# Patient Record
Sex: Female | Born: 1975
Health system: Southern US, Community
[De-identification: ages and names within clinical notes are randomized; demographics above are authoritative.]

## PROBLEM LIST (undated history)

## (undated) DIAGNOSIS — N200 Calculus of kidney: Secondary | ICD-10-CM

## (undated) DIAGNOSIS — R7303 Prediabetes: Secondary | ICD-10-CM

## (undated) DIAGNOSIS — E559 Vitamin D deficiency, unspecified: Secondary | ICD-10-CM

## (undated) DIAGNOSIS — R0989 Other specified symptoms and signs involving the circulatory and respiratory systems: Secondary | ICD-10-CM

## (undated) DIAGNOSIS — F419 Anxiety disorder, unspecified: Secondary | ICD-10-CM

## (undated) DIAGNOSIS — I10 Essential (primary) hypertension: Secondary | ICD-10-CM

## (undated) DIAGNOSIS — E7800 Pure hypercholesterolemia, unspecified: Secondary | ICD-10-CM

## (undated) DIAGNOSIS — M2550 Pain in unspecified joint: Secondary | ICD-10-CM

## (undated) HISTORY — DX: Vitamin D deficiency, unspecified: E55.9

## (undated) HISTORY — DX: Anxiety disorder, unspecified: F41.9

## (undated) HISTORY — PX: LITHOTRIPSY: SUR834

## (undated) HISTORY — DX: Prediabetes: R73.03

## (undated) HISTORY — DX: Pure hypercholesterolemia, unspecified: E78.00

## (undated) HISTORY — DX: Pain in unspecified joint: M25.50

## (undated) HISTORY — PX: CYSTECTOMY: SUR359

## (undated) HISTORY — PX: DILATION AND EVACUATION: SHX1459

## (undated) HISTORY — PX: OTHER SURGICAL HISTORY: SHX169

---

## 1999-06-04 ENCOUNTER — Other Ambulatory Visit: Admission: RE | Admit: 1999-06-04 | Discharge: 1999-06-04 | Payer: Self-pay | Admitting: Obstetrics and Gynecology

## 1999-07-23 ENCOUNTER — Ambulatory Visit (HOSPITAL_COMMUNITY): Admission: RE | Admit: 1999-07-23 | Discharge: 1999-07-23 | Payer: Self-pay | Admitting: Obstetrics and Gynecology

## 1999-07-23 ENCOUNTER — Encounter: Payer: Self-pay | Admitting: Obstetrics and Gynecology

## 1999-08-20 ENCOUNTER — Encounter: Payer: Self-pay | Admitting: Obstetrics and Gynecology

## 1999-08-20 ENCOUNTER — Ambulatory Visit (HOSPITAL_COMMUNITY): Admission: RE | Admit: 1999-08-20 | Discharge: 1999-08-20 | Payer: Self-pay | Admitting: Obstetrics and Gynecology

## 1999-12-06 ENCOUNTER — Inpatient Hospital Stay (HOSPITAL_COMMUNITY): Admission: AD | Admit: 1999-12-06 | Discharge: 1999-12-06 | Payer: Self-pay | Admitting: Obstetrics and Gynecology

## 1999-12-10 ENCOUNTER — Inpatient Hospital Stay (HOSPITAL_COMMUNITY): Admission: AD | Admit: 1999-12-10 | Discharge: 1999-12-10 | Payer: Self-pay | Admitting: Obstetrics and Gynecology

## 1999-12-11 ENCOUNTER — Encounter (INDEPENDENT_AMBULATORY_CARE_PROVIDER_SITE_OTHER): Payer: Self-pay

## 1999-12-11 ENCOUNTER — Inpatient Hospital Stay (HOSPITAL_COMMUNITY): Admission: AD | Admit: 1999-12-11 | Discharge: 1999-12-15 | Payer: Self-pay | Admitting: Obstetrics and Gynecology

## 1999-12-16 ENCOUNTER — Encounter: Admission: RE | Admit: 1999-12-16 | Discharge: 2000-01-11 | Payer: Self-pay | Admitting: Obstetrics and Gynecology

## 2000-06-27 ENCOUNTER — Other Ambulatory Visit: Admission: RE | Admit: 2000-06-27 | Discharge: 2000-06-27 | Payer: Self-pay | Admitting: Obstetrics and Gynecology

## 2001-06-02 ENCOUNTER — Ambulatory Visit (HOSPITAL_COMMUNITY): Admission: RE | Admit: 2001-06-02 | Discharge: 2001-06-02 | Payer: Self-pay | Admitting: Internal Medicine

## 2001-06-02 ENCOUNTER — Encounter: Payer: Self-pay | Admitting: Internal Medicine

## 2001-07-15 ENCOUNTER — Other Ambulatory Visit: Admission: RE | Admit: 2001-07-15 | Discharge: 2001-07-15 | Payer: Self-pay | Admitting: Obstetrics and Gynecology

## 2002-04-09 ENCOUNTER — Encounter: Admission: RE | Admit: 2002-04-09 | Discharge: 2002-04-09 | Payer: Self-pay | Admitting: Internal Medicine

## 2002-04-09 ENCOUNTER — Encounter: Payer: Self-pay | Admitting: Internal Medicine

## 2003-01-13 ENCOUNTER — Other Ambulatory Visit: Admission: RE | Admit: 2003-01-13 | Discharge: 2003-01-13 | Payer: Self-pay | Admitting: Obstetrics and Gynecology

## 2004-09-06 ENCOUNTER — Encounter (INDEPENDENT_AMBULATORY_CARE_PROVIDER_SITE_OTHER): Payer: Self-pay | Admitting: Specialist

## 2004-09-06 ENCOUNTER — Ambulatory Visit (HOSPITAL_COMMUNITY): Admission: RE | Admit: 2004-09-06 | Discharge: 2004-09-06 | Payer: Self-pay | Admitting: Obstetrics and Gynecology

## 2008-07-08 ENCOUNTER — Ambulatory Visit (HOSPITAL_COMMUNITY): Admission: RE | Admit: 2008-07-08 | Discharge: 2008-07-08 | Payer: Self-pay | Admitting: Obstetrics and Gynecology

## 2008-07-08 ENCOUNTER — Encounter (INDEPENDENT_AMBULATORY_CARE_PROVIDER_SITE_OTHER): Payer: Self-pay | Admitting: Obstetrics and Gynecology

## 2008-10-31 ENCOUNTER — Ambulatory Visit (HOSPITAL_COMMUNITY): Admission: RE | Admit: 2008-10-31 | Discharge: 2008-10-31 | Payer: Self-pay | Admitting: *Deleted

## 2008-12-29 ENCOUNTER — Ambulatory Visit (HOSPITAL_COMMUNITY): Admission: RE | Admit: 2008-12-29 | Discharge: 2008-12-29 | Payer: Self-pay | Admitting: *Deleted

## 2009-02-16 ENCOUNTER — Inpatient Hospital Stay (HOSPITAL_COMMUNITY): Admission: EM | Admit: 2009-02-16 | Discharge: 2009-02-19 | Payer: Self-pay | Admitting: Emergency Medicine

## 2009-02-16 ENCOUNTER — Ambulatory Visit (HOSPITAL_COMMUNITY): Admission: RE | Admit: 2009-02-16 | Discharge: 2009-02-16 | Payer: Self-pay | Admitting: Urology

## 2009-05-26 ENCOUNTER — Ambulatory Visit (HOSPITAL_COMMUNITY): Admission: RE | Admit: 2009-05-26 | Discharge: 2009-05-26 | Payer: Self-pay | Admitting: Obstetrics and Gynecology

## 2009-07-08 ENCOUNTER — Inpatient Hospital Stay (HOSPITAL_COMMUNITY): Admission: AD | Admit: 2009-07-08 | Discharge: 2009-07-09 | Payer: Self-pay | Admitting: Obstetrics and Gynecology

## 2009-08-18 ENCOUNTER — Encounter: Admission: RE | Admit: 2009-08-18 | Discharge: 2009-08-18 | Payer: Self-pay | Admitting: Obstetrics and Gynecology

## 2009-11-21 ENCOUNTER — Inpatient Hospital Stay (HOSPITAL_COMMUNITY): Admission: AD | Admit: 2009-11-21 | Discharge: 2009-11-22 | Payer: Self-pay | Admitting: Obstetrics and Gynecology

## 2010-01-10 ENCOUNTER — Encounter: Admission: RE | Admit: 2010-01-10 | Discharge: 2010-01-10 | Payer: Self-pay | Admitting: Obstetrics and Gynecology

## 2010-02-19 ENCOUNTER — Ambulatory Visit: Payer: Self-pay | Admitting: Obstetrics and Gynecology

## 2010-02-19 ENCOUNTER — Inpatient Hospital Stay (HOSPITAL_COMMUNITY): Admission: AD | Admit: 2010-02-19 | Discharge: 2010-02-19 | Payer: Self-pay | Admitting: Obstetrics and Gynecology

## 2010-02-21 ENCOUNTER — Inpatient Hospital Stay (HOSPITAL_COMMUNITY): Admission: RE | Admit: 2010-02-21 | Discharge: 2010-02-24 | Payer: Self-pay | Admitting: Obstetrics and Gynecology

## 2010-09-14 LAB — COMPREHENSIVE METABOLIC PANEL
ALT: 16 U/L (ref 0–35)
ALT: 18 U/L (ref 0–35)
AST: 34 U/L (ref 0–37)
Albumin: 2.6 g/dL — ABNORMAL LOW (ref 3.5–5.2)
Alkaline Phosphatase: 126 U/L — ABNORMAL HIGH (ref 39–117)
Alkaline Phosphatase: 174 U/L — ABNORMAL HIGH (ref 39–117)
Alkaline Phosphatase: 181 U/L — ABNORMAL HIGH (ref 39–117)
BUN: 6 mg/dL (ref 6–23)
CO2: 28 mEq/L (ref 19–32)
Chloride: 107 mEq/L (ref 96–112)
Creatinine, Ser: 0.59 mg/dL (ref 0.4–1.2)
GFR calc Af Amer: >60 mL/min (ref >60)
GFR calc Af Amer: >60 mL/min (ref >60)
GFR calc non Af Amer: >60 mL/min (ref >60)
Glucose, Bld: 81 mg/dL (ref 70–99)
Potassium: 3.7 mEq/L (ref 3.5–5.1)
Potassium: 3.9 mEq/L (ref 3.5–5.1)
Potassium: 4.3 mEq/L (ref 3.5–5.1)
Sodium: 134 mEq/L — ABNORMAL LOW (ref 135–145)
Total Bilirubin: 0.3 mg/dL (ref 0.3–1.2)
Total Bilirubin: 0.4 mg/dL (ref 0.3–1.2)
Total Protein: 6.1 g/dL (ref 6.0–8.3)
Total Protein: 6.4 g/dL (ref 6.0–8.3)

## 2010-09-14 LAB — CBC
HCT: 37.7 % (ref 36.0–46.0)
HCT: 43.1 % (ref 36.0–46.0)
Hemoglobin: 12.3 g/dL (ref 12.0–15.0)
Hemoglobin: 12.9 g/dL (ref 12.0–15.0)
MCH: 32.5 pg (ref 26.0–34.0)
MCHC: 33.8 g/dL (ref 30.0–36.0)
MCV: 95 fL (ref 78.0–100.0)
Platelets: 183 10*3/uL (ref 150–400)
Platelets: 195 10*3/uL (ref 150–400)
RBC: 3.8 MIL/uL — ABNORMAL LOW (ref 3.87–5.11)
RDW: 14.5 % (ref 11.5–15.5)
RDW: 14.8 % (ref 11.5–15.5)
RDW: 14.9 % (ref 11.5–15.5)
WBC: 11.3 10*3/uL — ABNORMAL HIGH (ref 4.0–10.5)
WBC: 11.8 10*3/uL — ABNORMAL HIGH (ref 4.0–10.5)
WBC: 8.6 10*3/uL (ref 4.0–10.5)

## 2010-09-14 LAB — URINALYSIS, ROUTINE W REFLEX MICROSCOPIC
Glucose, UA: NEGATIVE mg/dL
Protein, ur: NEGATIVE mg/dL
Urobilinogen, UA: 0.2 mg/dL (ref 0.0–1.0)

## 2010-09-14 LAB — BASIC METABOLIC PANEL
BUN: 6 mg/dL (ref 6–23)
Calcium: 9 mg/dL (ref 8.4–10.5)
Creatinine, Ser: 0.61 mg/dL (ref 0.4–1.2)
GFR calc non Af Amer: >60 mL/min (ref >60)
Glucose, Bld: 83 mg/dL (ref 70–99)

## 2010-09-14 LAB — SURGICAL PCR SCREEN: Staphylococcus aureus: NEGATIVE

## 2010-09-14 LAB — GLUCOSE, CAPILLARY

## 2010-09-17 LAB — WET PREP, GENITAL: Yeast Wet Prep HPF POC: NONE SEEN

## 2010-10-06 LAB — CBC
HCT: 43.9 % (ref 36.0–46.0)
Hemoglobin: 15.5 g/dL — ABNORMAL HIGH (ref 12.0–15.0)
MCHC: 35.3 g/dL (ref 30.0–36.0)
MCV: 93.3 fL (ref 78.0–100.0)
RBC: 4.7 MIL/uL (ref 3.87–5.11)
RDW: 12.9 % (ref 11.5–15.5)

## 2010-10-06 LAB — BASIC METABOLIC PANEL
CO2: 24 mEq/L (ref 19–32)
Chloride: 104 mEq/L (ref 96–112)
GFR calc Af Amer: >60 mL/min (ref >60)
Glucose, Bld: 131 mg/dL — ABNORMAL HIGH (ref 70–99)
Sodium: 136 mEq/L (ref 135–145)

## 2010-10-06 LAB — DIFFERENTIAL
Basophils Absolute: 0 10*3/uL (ref 0.0–0.1)
Basophils Relative: 0 % (ref 0–1)
Eosinophils Absolute: 0 10*3/uL (ref 0.0–0.7)
Eosinophils Relative: 0 % (ref 0–5)
Monocytes Absolute: 0.9 10*3/uL (ref 0.1–1.0)
Monocytes Relative: 5 % (ref 3–12)

## 2010-10-06 LAB — URINE MICROSCOPIC-ADD ON

## 2010-10-06 LAB — URINALYSIS, ROUTINE W REFLEX MICROSCOPIC
Bilirubin Urine: NEGATIVE
Nitrite: NEGATIVE
Protein, ur: 100 mg/dL — AB
Specific Gravity, Urine: 1.023 (ref 1.005–1.030)
Urobilinogen, UA: 0.2 mg/dL (ref 0.0–1.0)

## 2010-10-06 LAB — PREGNANCY, URINE
Preg Test, Ur: NEGATIVE
Preg Test, Ur: NEGATIVE

## 2010-10-15 LAB — CBC
MCHC: 33.8 g/dL (ref 30.0–36.0)
MCV: 94.2 fL (ref 78.0–100.0)
Platelets: 268 10*3/uL (ref 150–400)
RBC: 4.73 MIL/uL (ref 3.87–5.11)
RDW: 13 % (ref 11.5–15.5)

## 2010-11-13 NOTE — Op Note (Signed)
Rebekah Wright, Rebekah Wright                 ACCOUNT NO.:  [id number]   MEDICAL RECORD NO.:  [id number]          PATIENT TYPE:  AMB   LOCATION:  ENDO                         FACILITY:  [facility]   PHYSICIAN:  Georgiana Spinner, M.D.    DATE OF BIRTH:  07/04/1975   DATE OF PROCEDURE:  10/31/2008  DATE OF DISCHARGE:                               OPERATIVE REPORT   PROCEDURE:  Colonoscopy.   INDICATIONS:  Rectal bleeding, left lower quadrant abdominal pain.   ANESTHESIA:  Fentanyl 100 mcg, Versed 10 mg, Benadryl 50 mg.   PROCEDURE:  With the patient mildly sedated in the left lateral  decubitus position, the Pentax videoscopic pediatric colonoscope was  inserted in the rectum and passed under direct vision to the cecum  identified by ileocecal valve and appendiceal orifice, both of which  were photographed.  From this point, the colonoscope was slowly  withdrawn taking circumferential views of colonic mucosa, stopping in  the rectum which appeared normal on direct but showed hemorrhoids on  retroflexed view.  The endoscope was straightened and withdrawn.  The  patient's vital signs and pulse oximeter remained stable.  The patient  tolerated the procedure well without apparent complications.   FINDINGS:  Internal hemorrhoids, otherwise unremarkable colonoscopic  examination to the cecum.   PLAN:  Have patient follow-up with me as an outpatient.           ______________________________  Georgiana Spinner, M.D.     GMO/MEDQ  D:  10/31/2008  T:  10/31/2008  Job:  [id number]

## 2010-11-13 NOTE — Op Note (Signed)
Rebekah Wright, Rebekah Wright                 ACCOUNT NO.:  [id number]   MEDICAL RECORD NO.:  [id number]          PATIENT TYPE:  INP   LOCATION:  1309                         FACILITY:  [facility]   PHYSICIAN:  Lindaann Slough, M.D.  DATE OF BIRTH:  26-Oct-1975   DATE OF PROCEDURE:  02/18/2009  DATE OF DISCHARGE:                               OPERATIVE REPORT   PREOPERATIVE DIAGNOSIS:  Left ureteral stones.   POSTOPERATIVE DIAGNOSIS:  Left ureteral stones.   PROCEDURE:  1. Cystoscopy.  2. Left retrograde pyelogram.  3. Ureteroscopy.  4. Holmium laser left ureteral stone.  5. Stone extraction.  6. Insertion of double-J stent.   SURGEON:  Danae Chen, M.D.   ANESTHESIA:  General.   INDICATIONS:  The patient is a 35 year old female who had an ESL of a 13  x 9 mm stone of the left kidney on February 16, 2009.  The evening of the  procedure she started having severe left flank pain and came to the  emergency room.  A CT scan showed steinstrasse.  She continued to have  pain and has not passed any more stone fragments.  She is scheduled  today for cystoscopy, stone manipulation and insertion of double-J  stent.   DESCRIPTION OF PROCEDURE:  The patient was identified by her wrist band  and proper time-out was taken.   Under general anesthesia she was prepped and draped and placed in the  dorsal lithotomy position.  A panendoscope was inserted in the bladder.  There is some edema around the left ureteral orifice.  There is no stone  or tumor in the bladder.  The bladder mucosa is normal.  The right  ureteral orifice is normal.  A Glidewire was passed over a #6-French  open-ended ureteral catheter and passed through the cystoscope into the  left ureteral orifice.  The Glidewire was advanced all the way up into  the renal pelvis.  The #6-French open-ended catheter could not be passed  through the distal ureter because of the ureteral stone.  A #5-French  could not be passed either.  With the  open-ended catheter at the  ureteral orifice, the Glidewire was replaced with a sensor tip  guidewire.  The open-ended catheter was then removed.  The intramural  ureter was then dilated with the ureteroscope access sheath.  Then a  #6.5-French semi rigid ureteroscope was passed in the ureter.  There are  several small stone fragments in the distal ureter and those fragments  were removed with the nitinol basket.  A larger stone fragment was then  caught within the wires  of the stone basket and could not be extracted.  At this point the stone basket was cut and the stone basket were left in  place.  The ureteroscope was then reinserted in the ureter and with the  holmium laser, the  stone fragment was fragmented in multiple smaller  stone fragments and were removed with the stone basket.  After removing  the larger stone fragments, several smaller stone fragments that were  proximal to the larger fragments were removed with a  nitinol basket..   Retrograde pyelogram with the ureteroscope in the distal ureter.  Contrast was then injected through the ureteroscope.  There are several  smaller stone fragments in the distal ureter and those fragments are  small enough for the patient to pass them spontaneously.  There is no  evidence of extravasation of contrast.  The ureteroscope was then  removed.   The guidewire was then backloaded into the cystoscope and a #6-French 24  double-J catheter was passed over the guidewire.  The proximal curl of  the double-J stent is in the renal pelvis.  The distal curl is in the  bladder.  The bladder was then emptied and then the cystoscope and  guidewire were removed.   The patient tolerated the procedure well and left the OR in satisfactory  condition to post anesthesia care unit.      Lindaann Slough, M.D.  Electronically Signed     MN/MEDQ  D:  02/18/2009  T:  02/19/2009  Job:  [id number]

## 2010-11-13 NOTE — Discharge Summary (Signed)
NAMEBRIASIA, Rebekah Wright                 ACCOUNT NO.:  [id number]   MEDICAL RECORD NO.:  [id number]          PATIENT TYPE:  INP   LOCATION:  1309                         FACILITY:  [facility]   PHYSICIAN:  Lindaann Slough, M.D.  DATE OF BIRTH:  1975-10-04   DATE OF ADMISSION:  02/16/2009  DATE OF DISCHARGE:  02/19/2009                               DISCHARGE SUMMARY   DISCHARGE DIAGNOSIS:  Left renal and ureteral stone.   PROCEDURE:  Cystoscopy, ureteroscopy, stone extraction and insertion of  double-J stent on February 18, 2009.   HISTORY OF PRESENT ILLNESS:  The patient is a 45 years of female who had  ESL of a left renal stone on February 16, 2009.  Postoperatively she  started having severe flank pain and returned to the emergency room.  CT  scan showed several stone fragments in the left distal ureter with mild  hydronephrosis.  She was admitted for pain control and further  treatment.  She continued to have pain and on February 18, 2009 she had  cystoscopy and stone extraction done.   PHYSICAL EXAMINATION:  VITAL SIGNS:  Blood pressure was 134/94 on  admission, pulse 78, respiration 20, temperature 98.  LUNGS:  Clear.  HEART:  Regular rhythm.  ABDOMEN:  Soft, non distended, non tender in  the left flank and she had left CVA tenderness.   LABORATORY DATA:  Hemoglobin was 15.5, hematocrit 43.9 and WBC 17.3, BUN  6, creatinine 0.96.   HOSPITAL COURSE:  She remained afebrile throughout the hospital course  and after stone extraction and stent placement she markedly improved.  She did not have any more severe pain.  She has still has some flank  discomfort.  On February 19, 2009 she was very anxious and that was the  anniversary of her mother's death. She was given Xanax and she was less  anxious. In the afternoon of 08/22 her blood pressure was 154/94, pulse  78, respirations 20, temperature 98.5.  She was voiding well.  Her urine  was slightly blood tinged.   DISCHARGE MEDICATIONS:  She was  then discharged home on Prozac 20 mg  daily, Percocet 1 or 2 tablets q.4 h p.r.n. for pain.   DISCHARGE INSTRUCTIONS:  She was instructed to drink lots of fluids and  strain her urine.   Discharge Diet : Regular   FOLLOWUP:  She will be followed as an outpatient. The stent will then be  removed in 2-3 weeks.   CONDITION ON DISCHARGE:  Improved.     Lindaann Slough, M.D.  Electronically Signed    MN/MEDQ  D:  02/19/2009  T:  02/20/2009  Job:  [id number]

## 2010-11-13 NOTE — Op Note (Signed)
NAME:  Rebekah Wright, Rebekah Wright                 ACCOUNT NO.:  [id number]   MEDICAL RECORD NO.:  [id number]          PATIENT TYPE:  AMB   LOCATION:  SDC                           FACILITY:  [facility]   PHYSICIAN:  Zenaida Niece, M.D.DATE OF BIRTH:  07/20/1975   DATE OF PROCEDURE:  07/08/2008  DATE OF DISCHARGE:                               OPERATIVE REPORT   PREOPERATIVE DIAGNOSIS:  Vaginal cyst.   POSTOPERATIVE DIAGNOSIS:  Vaginal cyst.   PROCEDURE:  Removal of vaginal cyst.   SURGEON:  Zenaida Niece, MD   ANESTHESIA:  Monitored anesthesia care and local block.   FINDINGS:  She had an egg-shaped/sized posterior left vaginal cyst.   SPECIMENS:  Vaginal cyst sent for routine pathology.   ESTIMATED BLOOD LOSS:  200 mL.   COMPLICATIONS:  None.   PROCEDURE IN DETAIL:  The patient was taken to the operating room and  placed in the dorsal supine position.  She was given some IV sedation  and placed in mobile stirrups.  Perineum and vagina were then prepped  and draped in the usual sterile fashion, and bladder was drained with a  latex-free catheter.  A Deaver retractor was used anteriorly and I used  posterior retraction with my hand to expose the cyst, which was  posterior towards the patient's left side.  Marcaine 0.5% with  epinephrine was injected in the vaginal mucosa over the cyst.  I then  made a vertical incision over the cyst attempting not to enter the cyst.  However, it was just beneath the vaginal mucosa and the cyst was entered  and drained clear mucous fluid.  I then grabbed the edges of the vagina  and used sharp dissection to dissect the cyst wall away from the  remainder of the vagina.  This proved to be difficult just getting  adequate exposure and the patient was moving around a fair bit.  The  cyst wall was eventually removed and there was some significant bleeding  controlled with electrocautery and figure-of-eight suture of 2-0 Vicryl.  Adequate hemostasis was  achieved.  I was not able to visualize any  further remnants of the cyst wall.  The vaginal incision was then closed  with running locking 2-0 Vicryl with adequate closure and adequate  hemostasis.  The patient was then taken  down from stirrups after all instruments were removed from the vagina.  The patient was taken to the recovery room in stable condition.  Counts  were correct and, she received Ancef 1 g IV at the beginning of the  procedure and had PAS hose on throughout the procedure.      Zenaida Niece, M.D.  Electronically Signed     TDM/MEDQ  D:  07/08/2008  T:  07/08/2008  Job:  [id number]

## 2010-11-16 NOTE — Op Note (Signed)
Rebekah Wright, Rebekah Wright                 ACCOUNT NO.:  [id number]   MEDICAL RECORD NO.:  [id number]          PATIENT TYPE:  AMB   LOCATION:  SDC                           FACILITY:  [facility]   PHYSICIAN:  Zenaida Niece, M.D.DATE OF BIRTH:  Sep 24, 1975   DATE OF PROCEDURE:  09/06/2004  DATE OF DISCHARGE:                                 OPERATIVE REPORT   PREOPERATIVE DIAGNOSIS:  Missed abortion.   POSTOPERATIVE DIAGNOSIS:  Missed abortion.   PROCEDURE:  Dilation and evacuation.   SURGEON:  Zenaida Niece, M.D.   ANESTHESIA:  Monitored anesthesia care and paracervical block.   SPECIMENS:  Products of conception.   ESTIMATED BLOOD LOSS:  50 mL.   FINDINGS:  The patient had a slightly enlarged uterus that sounded to 10 cm  with a closed cervical os.  Moderate products of conception were obtained.   PROCEDURE IN DETAIL:  The patient was taken to the operating room and placed  on dorsal supine position.  She was given IV sedation and placed in mobile  stirrups.  The perineum and vagina were then prepped and draped in the usual  sterile fashion and the bladder drained with a red rubber catheter.  A  Graves speculum was inserted into the vagina and the anterior lip of the  cervix grasped with a single-tooth tenaculum.  A paracervical block was then  performed with 16 mL of 2% lidocaine.  Uterus then sounded to approximately  10 cm.  Cervix was gradually easily dilated to a size 27 dilator.  A size 8  curved curette was then introduced and suction curettage performed with  return of moderate products of conception.  Sharp curettage then revealed  some smoothness on the patient's left side of the uterine cavity.  Suction  curettage was performed with return of further products of conception.  Sharp curettage and was performed with good uterine cry in all quadrants and  no further significant tissue.  Suction curettage performed one last time revealed a small amount of blood.  The  single-tooth tenaculum was removed and bleeding controlled with  pressure.  All instruments were then removed from the vagina.  The patient  tolerated the procedure well and was taken to recovery in stable condition.  Counts were correct.      TDM/MEDQ  D:  09/06/2004  T:  09/06/2004  Job:  [id number]

## 2010-11-16 NOTE — Discharge Summary (Signed)
[facility]  Patient:    Rebekah Wright, Rebekah Wright                     MRN: [id number] Adm. Date:  [id number] Disc. Date: [id number] Attending:  Michaele Offer                           Discharge Summary  ADMISSION DIAGNOSES:          1. Intrauterine pregnancy at 38 weeks.                               2. Pregnancy-induced hypertension.                               3. Group B Strep carrier.  DISCHARGE DIAGNOSES:          1. Intrauterine pregnancy at 38 weeks, delivered.                               2. Pregnancy-induced hypertension.                               3. Group B Strep carrier.                               4. Nonreassuring fetal heart rate tracing.                               5. Placental abruption.  PROCEDURE:                    Primary low transverse cesarean section without extensions.  COMPLICATIONS:                Nonreassuring fetal status with abruption.  CONSULTING PHYSICIANS:        None.  HISTORY OF PRESENT ILLNESS:   This is a 35 year old white female, gravida 1, para 0, with an EGA of 38+ weeks by an LMP consistent with an 8-week ultrasound with an [facility] of December 22, 1999, who presented on June 12, for induction due to [facility] with a  favorable cervix.  Prenatal care was complicated only by a slightly elevated blood pressure for the past week to 160/90 to 140/100 without symptoms.  PRENATAL LABORATORY DATA:     Blood type is O positive with a negative antibody  screen, RPR nonreactive, rubella nonimmune.  Hepatitis B surface antigen negative, HIV negative.  Gonorrhea and Chlamydia negative.  Triple screen normal.  GCT 104. Group B Strep positive.  GYN HISTORY:                  Significant for trichomonas two years ago.  PAST SURGICAL HISTORY:        Significant for wisdom tooth removal.  The remainder of her history is noncontributory.  PHYSICAL EXAMINATION:         VITAL SIGNS: Blood pressure was 142/89.  She  was afebrile with stable vital signs.  Fetal heart rate tracing was reactive without decellerations on admission and on the monitor she had occasional contractions.  ABDOMEN: Gravid and nontender with  an estimated fetal weight of 7-1/2 to 8 pounds and a vertex presentation.  EXTREMITIES: Trace edema, DTRs were 1 out of 4 with no clonus.  PELVIC: Vaginal examination was 3 to 4, 70, and -1 with a vertex presentation.  She was noted to have a nodule on her cervix at approximately 7 oclock.  HOSPITAL COURSE:              The patient was admitted and artificial rupture of membranes was performed with return of clear fluid for labor induction.  After his was performed, the fetal heart rate tracing had a decelleration to the 60s with  spontaneous recovery.  She then began to have some vaginal bleeding and had another fetal decelleration to the 50s to 60s.   The baseline recovered to the 120s to 130s, but had a very irregular pattern with frequent decellerations to the 90s.  Due to the fact that the fetal heart rate tracing was nonreassuring and she was  remote from delivery, we elected to proceed with a cesarean section.  On the morning of June 12, she underwent a primary low transverse cesarean section without extensions, done under spinal anesthesia with an estimated blood loss of 1000 cc. She had normal anatomy and delivered a viable female infant with Apgars of 4 and 7 that weighed 7 pounds 11 ounces and cord arterial pH was 6.98.  There was noted to be a significant amount of blood clot and bloody fluid in the amniotic cavity and the baby had blood from its respiratory secretions.  The baby eventually required admission to the neonatal intensive care unit for respiratory problems and rule out sepsis.  The patient did very well, remained afebrile, and was rapidly able to ambulate and tolerate a regular diet.  Preoperative hemoglobin was 12.2, postoperative hemoglobin was 10.1.   On the morning of postoperative day #4, she was breastfeeding her baby which was due to be discharged from the NICU on the following day.  Her incision was healing well and her Prolene subcuticular suture was removed and Steri-Strips remained intact.  At this time, she was felt to be  stable for discharge home.  CONDITION ON DISCHARGE:       Stable.  DISPOSITION:                  Discharged to home.  DISCHARGE INSTRUCTIONS:       Her diet is a regular diet.  Her activity is pelvic rest.  No driving, and no strenuous activity.  FOLLOW-UP:                    Her follow-up is in approximately 10 days for an incision check.  DISCHARGE MEDICATIONS:        1. Percocet p.r.n. pain.  She is given our discharge summary pamphlet. DD:  12/15/99 TD:  12/18/99 Job: [id number]

## 2010-11-16 NOTE — Op Note (Signed)
[facility]  Patient:    Rebekah Wright, Rebekah Wright                     MRN: [id number] Proc. Date: 12/11/99 Adm. Date:  [id number] Attending:  Michaele Offer                           Operative Report  PREOPERATIVE DIAGNOSES:       1. Intrauterine pregnancy at 38+ weeks.                               2. Pregnancy induced hypertension.                               3. Positive group B Strep.                               4. Nonreassuring fetal status.  POSTOPERATIVE DIAGNOSIS:      1. Intrauterine pregnancy at 38+ weeks.                               2. Pregnancy induced hypertension.                               3. Positive group B Strep.                               4. Nonreassuring fetal status.                               5. Abruptio placentae.  PROCEDURE:                    Primary low transverse cesarean section without extension.  SURGEON:                      Zenaida Niece, M.D.  ANESTHESIA:                   Spinal.  ESTIMATED BLOOD LOSS:         1000 cc.  CHEMOPROPHYLAXIS:             Ancef 1 g after cord clamp.  FINDINGS:                     The patient had normal anatomy.  She delivered a viable female infant with APGAR 4 and 7, weight 7 pounds 11 ounces.  Cord arterial pH was 6.98.  Upon entering the amniotic cavity, there was noted to be some blood clot and blood fluid, even coming from the babys nostrils.  COUNTS:                       Correct.  CONDITION:                    Stable. PROCEDURE IN DETAIL:          After appropriate informed consent was obtained, the patient was taken to the operating room and initially  placed in the dorsal supine position.  Fetal heart tracing was initially in the 90s and we were going to proceed with general anesthesia.  However, fetal heart tracing increased to the 130s.  Dr. Tacy Dura was able to instill spinal anesthesia.  She was placed in the dorsal supine position with a left lateral  tilt.  Her abdomen was prepped and draped in the usual sterile fashion.  A Foley catheter had previously been placed.  The level of her anesthesia was found to be adequate and her abdomen was entered via a standard Pfannenstiel incision. The vesicouterine peritoneum was incised and a bladder flap created distally. A 4 cm transverse incision was made in the lower uterine segment and, once the amniotic cavity was entered, the incision was extended bilaterally digitally. Again noted were blood clots and blood fluid.  The fetal vertex was grasped and delivered through the incision atraumatically.  The mouth and nares were suctioned and the remainder of the infant delivered atraumatically. The cord was doubly clamped and cut and the infant handed to the waiting pediatric team.  Cord blood and cord gas were obtained and the placenta delivered spontaneously.  The uterus was wiped dry to remove all clots and debris.  The incision was inspected and found to be free of extensions.  The uterine incision was closed in one layer, being a running locking layer with #1 chromic.  This achieved adequate hemostasis.  Both tubes and ovaries were inspected and found to be normal.  Bleeding from the serosal edges was controlled with electrocautery.  The uterine incision was again inspected and found to be hemostatic.  The subfascial space was irrigated and made hemostatic with electrocautery.  The fascia was closed in a running fashion starting at both ends and meeting in the middle with 0 Vicryl.  The subcutaneous tissue was then irrigated and made hemostatic with electrocautery.  The skin was closed with a running subcuticular suture of 4-0 Prolene followed by Steri-Strips and a sterile bandage.  The patient tolerated the procedure well and was taken to the recovery room in stable condition.DD: 12/11/99 TD:  12/13/99 Job: [id number]

## 2011-09-07 ENCOUNTER — Emergency Department (HOSPITAL_COMMUNITY): Payer: Self-pay

## 2011-09-07 ENCOUNTER — Encounter (HOSPITAL_COMMUNITY): Payer: Self-pay | Admitting: *Deleted

## 2011-09-07 ENCOUNTER — Emergency Department (HOSPITAL_COMMUNITY)
Admission: EM | Admit: 2011-09-07 | Discharge: 2011-09-07 | Disposition: A | Discharge status: Home / Self Care | Payer: Self-pay | Attending: Emergency Medicine | Admitting: Emergency Medicine

## 2011-09-07 DIAGNOSIS — F17200 Nicotine dependence, unspecified, uncomplicated: Secondary | ICD-10-CM | POA: Insufficient documentation

## 2011-09-07 DIAGNOSIS — R059 Cough, unspecified: Secondary | ICD-10-CM | POA: Insufficient documentation

## 2011-09-07 DIAGNOSIS — J3489 Other specified disorders of nose and nasal sinuses: Secondary | ICD-10-CM | POA: Insufficient documentation

## 2011-09-07 DIAGNOSIS — Z79899 Other long term (current) drug therapy: Secondary | ICD-10-CM | POA: Insufficient documentation

## 2011-09-07 DIAGNOSIS — R05 Cough: Secondary | ICD-10-CM | POA: Insufficient documentation

## 2011-09-07 DIAGNOSIS — I10 Essential (primary) hypertension: Secondary | ICD-10-CM | POA: Insufficient documentation

## 2011-09-07 HISTORY — DX: Essential (primary) hypertension: I10

## 2011-09-07 MED ORDER — BENZONATATE 100 MG PO CAPS: 100.0000 mg | ORAL_CAPSULE | Freq: Three times a day (TID) | ORAL | Status: AC

## 2011-09-07 MED ORDER — AZITHROMYCIN 250 MG PO TABS
500.0000 mg | ORAL_TABLET | Freq: Once | ORAL | Status: AC
  Administered 2011-09-07: 500 mg via ORAL
  Filled 2011-09-07: qty 2

## 2011-09-07 MED ORDER — PREDNISONE 20 MG PO TABS: 60.0000 mg | ORAL_TABLET | Freq: Every day | ORAL | Status: AC

## 2011-09-07 MED ORDER — AZITHROMYCIN 250 MG PO TABS: 250.0000 mg | ORAL_TABLET | Freq: Every day | ORAL | Status: DC

## 2011-09-07 MED ORDER — PREDNISONE 20 MG PO TABS
60.0000 mg | ORAL_TABLET | Freq: Once | ORAL | Status: AC
  Administered 2011-09-07: 60 mg via ORAL
  Filled 2011-09-07: qty 3

## 2011-09-07 MED ORDER — AZITHROMYCIN 250 MG PO TABS: 250.0000 mg | ORAL_TABLET | Freq: Every day | ORAL | Status: AC

## 2011-09-07 MED ORDER — BENZONATATE 100 MG PO CAPS: 100.0000 mg | ORAL_CAPSULE | Freq: Three times a day (TID) | ORAL | Status: DC

## 2011-09-07 NOTE — ED Provider Notes (Signed)
History     CSN: [id number]  Arrival date & time 09/07/11  1519   First MD Initiated Contact with Patient 09/07/11 1546      Chief Complaint  Patient presents with  . Cough    HPI The patient presents with 6 weeks of cough and no fevers and chills.  She also symptoms began gradually.  Since onset she had largely just cough and mild congestion.  This persisted in spite of OTC medication use.  Over the past week she has developed intermittent fevers and chills, most notably at night.  She denies any significant nausea, vomiting, diarrhea, confusion, disorientation, chest pain, abdominal pain. The patient continues to smoke. Past Medical History  Diagnosis Date  . Hypertension     History reviewed. No pertinent past surgical history.  History reviewed. No pertinent family history.  History  Substance Use Topics  . Smoking status: Current Everyday Smoker  . Smokeless tobacco: Not on file  . Alcohol Use: No    OB History    Grav Para Term Preterm Abortions TAB SAB Ect Mult Living                  Review of Systems  Constitutional:       HPI  HENT:       HPI otherwise negative  Eyes: Negative.   Respiratory:       HPI, otherwise negative  Cardiovascular:       HPI, otherwise nmegative  Gastrointestinal: Negative for vomiting.  Genitourinary:       HPI, otherwise negative  Musculoskeletal:       HPI, otherwise negative  Skin: Negative.   Neurological: Negative for syncope.    Allergies  Review of patient's allergies indicates no known allergies.  Home Medications   Current Outpatient Rx  Name Route Sig Dispense Refill  . MUCINEX PO Oral Take 20 mLs by mouth 2 (two) times daily as needed. For cough.    . IBUPROFEN 200 MG PO TABS Oral Take 800 mg by mouth every 8 (eight) hours as needed. For headache.    Marland Kitchen LEVONORGESTREL 20 MCG/24HR IU IUD Intrauterine 1 each by Intrauterine route once.    . AZITHROMYCIN 250 MG PO TABS Oral Take 1 tablet (250 mg total) by  mouth daily. Take 1 every day until finished. 4 tablet 0  . BENZONATATE 100 MG PO CAPS Oral Take 1 capsule (100 mg total) by mouth every 8 (eight) hours. 21 capsule 0  . PREDNISONE 20 MG PO TABS Oral Take 3 tablets (60 mg total) by mouth daily. 12 tablet 0    BP 155/109  Pulse 93  Temp(Src) 98.5 F (36.9 C) (Oral)  Resp 18  SpO2 97%  Physical Exam  Nursing note and vitals reviewed. Constitutional: She is oriented to person, place, and time. She appears well-developed and well-nourished. No distress.  HENT:  Head: Normocephalic and atraumatic.  Eyes: Conjunctivae and EOM are normal.  Cardiovascular: Normal rate and regular rhythm.   Pulmonary/Chest: Effort normal and breath sounds normal. No stridor. No respiratory distress.  Abdominal: She exhibits no distension.  Musculoskeletal: She exhibits no edema.  Neurological: She is alert and oriented to person, place, and time. No cranial nerve deficit.  Skin: Skin is warm and dry.  Psychiatric: She has a normal mood and affect.    ED Course  Procedures (including critical care time)  Labs Reviewed - No data to display Dg Chest 2 View  09/07/2011  *RADIOLOGY REPORT*  Clinical  Data: Cough.  CHEST - 2 VIEW  Comparison: None  Findings: The cardiac silhouette, mediastinal and hilar contours are within normal limits.  Slight beaking of the right heart border is likely due to epicardial fat.  The lungs are clear.  No pleural effusion.  The bony thorax is intact.  IMPRESSION: No acute cardiopulmonary findings.  Original Report Authenticated By: P. Loralie Champagne, M.D.   X-ray reviewed by me  1. Cough       MDM  This previously well female now presents with 6 weeks of cough and dyspnea fevers with chills.  On exam the patient is in no distress though she is uncomfortable.  Patient's x-ray does not demonstrate pneumonia.  Given the patient's smoking history, her ongoing cough, she was treated with steroids and antibiotics for bronchitis.   Patient was advised of the necessity to stop smoking, as well as the need for continued outpatient management via primary care physician.  She was discharged in stable condition.     Gerhard Munch, MD 09/07/11 [phone]

## 2011-09-07 NOTE — ED Notes (Signed)
The pt has had a cough productive for 6 weeks with a low grade temp.  chills

## 2011-11-10 ENCOUNTER — Emergency Department (HOSPITAL_COMMUNITY)
Admission: EM | Admit: 2011-11-10 | Discharge: 2011-11-11 | Disposition: A | Discharge status: Home / Self Care | Payer: Self-pay | Attending: Emergency Medicine | Admitting: Emergency Medicine

## 2011-11-10 ENCOUNTER — Encounter (HOSPITAL_COMMUNITY): Payer: Self-pay | Admitting: Emergency Medicine

## 2011-11-10 DIAGNOSIS — I10 Essential (primary) hypertension: Secondary | ICD-10-CM | POA: Insufficient documentation

## 2011-11-10 DIAGNOSIS — J039 Acute tonsillitis, unspecified: Secondary | ICD-10-CM | POA: Insufficient documentation

## 2011-11-10 HISTORY — DX: Calculus of kidney: N20.0

## 2011-11-10 LAB — RAPID STREP SCREEN (MED CTR MEBANE ONLY): Streptococcus, Group A Screen (Direct): NEGATIVE

## 2011-11-10 NOTE — ED Notes (Signed)
PT. REPORTS SORE THROAT / SWELLING WITH LOW GRADE FEVER /CHILLS AND HARD TO SWALLOW. DENIES COUGH.

## 2011-11-11 MED ORDER — CLINDAMYCIN HCL 300 MG PO CAPS: 300.0000 mg | ORAL_CAPSULE | Freq: Three times a day (TID) | ORAL | Status: AC

## 2011-11-11 MED ORDER — HYDROCODONE-ACETAMINOPHEN 7.5-500 MG/15ML PO SOLN: 15.0000 mL | Freq: Four times a day (QID) | ORAL | Status: AC | PRN

## 2011-11-11 MED ORDER — KETOROLAC TROMETHAMINE 30 MG/ML IJ SOLN
30.0000 mg | Freq: Once | INTRAMUSCULAR | Status: AC
  Administered 2011-11-11: 30 mg via INTRAVENOUS
  Filled 2011-11-11: qty 1

## 2011-11-11 MED ORDER — SODIUM CHLORIDE 0.9 % IV BOLUS (SEPSIS)
1000.0000 mL | Freq: Once | INTRAVENOUS | Status: AC
  Administered 2011-11-11: 1000 mL via INTRAVENOUS

## 2011-11-11 MED ORDER — CLINDAMYCIN PHOSPHATE 900 MG/50ML IV SOLN
900.0000 mg | Freq: Once | INTRAVENOUS | Status: AC
  Administered 2011-11-11: 900 mg via INTRAVENOUS
  Filled 2011-11-11: qty 50

## 2011-11-11 MED ORDER — MORPHINE SULFATE 4 MG/ML IJ SOLN
6.0000 mg | Freq: Once | INTRAMUSCULAR | Status: AC
  Administered 2011-11-11: 6 mg via INTRAVENOUS
  Filled 2011-11-11: qty 2

## 2011-11-11 NOTE — Discharge Instructions (Signed)
Tonsillitis Tonsils are lumps of lymphoid tissues at the back of the throat. Each tonsil has 20 crevices (crypts). Tonsils help fight nose and throat infections and keep infection from spreading to other parts of the body for the first 18 months of life. Tonsillitis is an infection of the throat that causes the tonsils to become red, tender, and swollen. CAUSES Sudden and, if treated, temporary (acute) tonsillitis is usually caused by infection with streptococcal bacteria. Long lasting (chronic) tonsillitis occurs when the crypts of the tonsils become filled with pieces of food and bacteria, which makes it easy for the tonsils to become constantly infected. SYMPTOMS  Symptoms of tonsillitis include:  A sore throat.   White patches on the tonsils.   Fever.   Tiredness.  DIAGNOSIS Tonsillitis can be diagnosed through a physical exam. Diagnosis can be confirmed with the results of lab tests, including a throat culture. TREATMENT  The goals of tonsillitis treatment include the reduction of the severity and duration of symptoms, prevention of associated conditions, and prevention of disease transmission. Tonsillitis caused by bacteria can be treated with antibiotics. Usually, treatment with antibiotics is started before the cause of the tonsillitis is known. However, if it is determined that the cause is not bacterial, antibiotics will not treat the tonsillitis. If attacks of tonsillitis are severe and frequent, your caregiver may recommend surgery to remove the tonsils (tonsillectomy). HOME CARE INSTRUCTIONS   Rest as much as possible and get plenty of sleep.   Drink plenty of fluids. While the throat is very sore, eat soft foods or liquids, such as sherbet, soups, or instant breakfast drinks.   Eat frozen ice pops.   Older children and adults may gargle with a warm or cold liquid to help soothe the throat. Mix 1 teaspoon of salt in 1 cup of water.   Other family members who also develop a  sore throat or fever should have a medical exam or throat culture.   Only take over-the-counter or prescription medicines for pain, discomfort, or fever as directed by your caregiver.   If you are given antibiotics, take them as directed. Finish them even if you start to feel better.  SEEK MEDICAL CARE IF:   Your baby is older than 3 months with a rectal temperature of 100.5 F (38.1 C) or higher for more than 1 day.   Large, tender lumps develop in your neck.   A rash develops.   Green, yellow-brown, or bloody substance is coughed up.   You are unable to swallow liquids or food for 24 hours.   Your child is unable to swallow food or liquids for 12 hours.  SEEK IMMEDIATE MEDICAL CARE IF:   You develop any new symptoms such as vomiting, severe headache, stiff neck, chest pain, or trouble breathing or swallowing.   You have severe throat pain along with drooling or voice changes.   You have severe pain, unrelieved with recommended medications.   You are unable to fully open the mouth.   You develop redness, swelling, or severe pain anywhere in the neck.   You have a fever.   Your baby is older than 3 months with a rectal temperature of 102 F (38.9 C) or higher.   Your baby is 12 months old or younger with a rectal temperature of 100.4 F (38 C) or higher.  MAKE SURE YOU:   Understand these instructions.   Will watch your condition.   Will get help right away if you are not  watch your condition.   Will get help right away if you are not doing well or get worse.  Document Released: 03/27/2005 Document Revised: 06/06/2011 Document Reviewed: 08/23/2010  [employer] Patient Information 2012 [employer].

## 2011-11-11 NOTE — ED Provider Notes (Signed)
History     CSN: [id number]  Arrival date & time 11/10/11  2157   First MD Initiated Contact with Patient 11/11/11 0006      Chief Complaint  Patient presents with  . Sore Throat     The history is provided by the patient.   patient reports worsening sore throat for 4 days.  She's had low-grade fever and chills.  She reports pain with swallowing.  Her pain is moderate to severe at this time.  She reports decreased oral intake of both solids and fluids.  She's had no recent sick contacts.  She denies dental pain.  She denies difficulty breathing.  No report of change in voice.  Past Medical History  Diagnosis Date  . Hypertension   . Kidney stone     Past Surgical History  Procedure Date  . Lithotripsy     No family history on file.  History  Substance Use Topics  . Smoking status: Former Games developer  . Smokeless tobacco: Not on file  . Alcohol Use: No    OB History    Grav Para Term Preterm Abortions TAB SAB Ect Mult Living                  Review of Systems  All other systems reviewed and are negative.    Allergies  Review of patient's allergies indicates no known allergies.  Home Medications   Current Outpatient Rx  Name Route Sig Dispense Refill  . LEVONORGESTREL 20 MCG/24HR IU IUD Intrauterine 1 each by Intrauterine route once.    Marland Kitchen CLINDAMYCIN HCL 300 MG PO CAPS Oral Take 1 capsule (300 mg total) by mouth 3 (three) times daily. 30 capsule 0    May also dose in liquid formulation, same dose, fr ...  . HYDROCODONE-ACETAMINOPHEN 7.5-500 MG/15ML PO SOLN Oral Take 15 mLs by mouth every 6 (six) hours as needed for pain. 240 mL 0    BP 130/94  Pulse 102  Temp(Src) 99.4 F (37.4 C) (Oral)  Resp 18  Ht 5' 4.5" (1.638 m)  Wt 190 lb (86.183 kg)  BMI 32.11 kg/m2  SpO2 98%  Physical Exam  Nursing note and vitals reviewed. Constitutional: She is oriented to person, place, and time. She appears well-developed and well-nourished. No distress.  HENT:   Mucous membranes dry.  Uvula is midline.  Tolerating secretions.  Oral airway is patent.  Erythematous and swollen tonsils with evidence of white exudate bilaterally.  No peritonsillar abscess noted  Eyes: EOM are normal.  Neck: Normal range of motion.  Cardiovascular: Normal rate, regular rhythm and normal heart sounds.   Pulmonary/Chest: Effort normal and breath sounds normal.  Abdominal: Soft. She exhibits no distension. There is no tenderness.  Musculoskeletal: Normal range of motion.  Neurological: She is alert and oriented to person, place, and time.  Skin: Skin is warm and dry.  Psychiatric: She has a normal mood and affect. Judgment normal.    ED Course  Procedures (including critical care time)   Labs Reviewed  RAPID STREP SCREEN   No results found.   1. Tonsillitis with exudate       MDM  Severe tonsillitis with volume depletion.  IV pain medicine fluids and IV antibiotics in the emergency department.  She be discharged home with a prescription for oral pain medicine and clindamycin.  She understands to return to the emergency department for new or worsening symptoms.  She understands that despite treatment she still may progress onto is more  severe throat infection that may require incision and drainage such as a peritonsillar abscess.        Lyanne Co, MD 11/11/11 [phone]

## 2011-11-11 NOTE — ED Notes (Signed)
Patient is AOx4 and comfortable with her discharge instructions.  Patient's significant other is driving her home. 

## 2012-02-28 ENCOUNTER — Encounter (HOSPITAL_COMMUNITY): Payer: Self-pay | Admitting: *Deleted

## 2012-02-28 DIAGNOSIS — N3090 Cystitis, unspecified without hematuria: Secondary | ICD-10-CM | POA: Insufficient documentation

## 2012-02-28 DIAGNOSIS — I10 Essential (primary) hypertension: Secondary | ICD-10-CM | POA: Insufficient documentation

## 2012-02-28 DIAGNOSIS — D72829 Elevated white blood cell count, unspecified: Secondary | ICD-10-CM | POA: Insufficient documentation

## 2012-02-28 DIAGNOSIS — Z87891 Personal history of nicotine dependence: Secondary | ICD-10-CM | POA: Insufficient documentation

## 2012-02-28 LAB — CBC WITH DIFFERENTIAL/PLATELET
Eosinophils Absolute: 0 10*3/uL (ref 0.0–0.7)
Eosinophils Relative: 0 % (ref 0–5)
Hemoglobin: 15.1 g/dL — ABNORMAL HIGH (ref 12.0–15.0)
Lymphs Abs: 1.3 10*3/uL (ref 0.7–4.0)
MCH: 31.3 pg (ref 26.0–34.0)
MCHC: 34.7 g/dL (ref 30.0–36.0)
MCV: 90.2 fL (ref 78.0–100.0)
Monocytes Relative: 8 % (ref 3–12)
Platelets: 215 10*3/uL (ref 150–400)
RBC: 4.82 MIL/uL (ref 3.87–5.11)

## 2012-02-28 LAB — URINE MICROSCOPIC-ADD ON

## 2012-02-28 LAB — COMPREHENSIVE METABOLIC PANEL
BUN: 8 mg/dL (ref 6–23)
Calcium: 9.4 mg/dL (ref 8.4–10.5)
GFR calc Af Amer: >90 mL/min (ref >90)
Glucose, Bld: 153 mg/dL — ABNORMAL HIGH (ref 70–99)
Total Protein: 7.7 g/dL (ref 6.0–8.3)

## 2012-02-28 LAB — URINALYSIS, ROUTINE W REFLEX MICROSCOPIC
Ketones, ur: 15 mg/dL — AB
Nitrite: NEGATIVE
Protein, ur: 100 mg/dL — AB
pH: 6 (ref 5.0–8.0)

## 2012-02-28 MED ORDER — ACETAMINOPHEN 325 MG PO TABS
650.0000 mg | ORAL_TABLET | Freq: Once | ORAL | Status: AC
  Administered 2012-02-28: 650 mg via ORAL
  Filled 2012-02-28: qty 2

## 2012-02-28 NOTE — ED Notes (Signed)
The pt has had lower abd pain for 4 days with nv and pressure when she urinates.  Blood today.lmp iud

## 2012-02-29 ENCOUNTER — Emergency Department (HOSPITAL_COMMUNITY)
Admission: EM | Admit: 2012-02-29 | Discharge: 2012-02-29 | Disposition: A | Discharge status: Home / Self Care | Payer: Self-pay | Attending: Emergency Medicine | Admitting: Emergency Medicine

## 2012-02-29 DIAGNOSIS — D72829 Elevated white blood cell count, unspecified: Secondary | ICD-10-CM

## 2012-02-29 DIAGNOSIS — N3090 Cystitis, unspecified without hematuria: Secondary | ICD-10-CM

## 2012-02-29 MED ORDER — ONDANSETRON 4 MG PO TBDP: 4.0000 mg | ORAL_TABLET | Freq: Three times a day (TID) | ORAL | Status: AC | PRN

## 2012-02-29 MED ORDER — CEPHALEXIN 500 MG PO CAPS: 500.0000 mg | ORAL_CAPSULE | Freq: Three times a day (TID) | ORAL | Status: AC

## 2012-02-29 MED ORDER — LIDOCAINE HCL (PF) 1 % IJ SOLN
INTRAMUSCULAR | Status: AC
  Administered 2012-02-29: 02:00:00
  Filled 2012-02-29: qty 5

## 2012-02-29 MED ORDER — CEFTRIAXONE SODIUM 1 G IJ SOLR
1.0000 g | Freq: Once | INTRAMUSCULAR | Status: AC
  Administered 2012-02-29: 1 g via INTRAMUSCULAR
  Filled 2012-02-29: qty 10

## 2012-02-29 NOTE — ED Provider Notes (Signed)
History     CSN: [id number]  Arrival date & time 02/28/12  2149   First MD Initiated Contact with Patient 02/29/12 [phone]      Chief Complaint  Patient presents with  . Abdominal Pain    (Consider location/radiation/quality/duration/timing/severity/associated sxs/prior treatment) HPI Comments: The patient is a 36 year old female who has a history of high blood pressure and kidney stones who presents with approximately 4 days of intermittent nausea and dysuria associated with a mild suprapubic pressure when she urinates. She has had one day of fever which fluctuates but really denies abdominal pain and has no CVA pain or back pain. The symptoms are relatively persistent over the last 4 days and gradually getting worse. She's had no medications for these complaints and denies any history of urinary infections though she has had kidney stones in the past. She has an intrauterine device and states that she does not have regular menstrual cycles, she did have intermittent spotting today.  She denies cough, shortness of breath, headache, back pain, neck pain, rashes, swelling, diarrhea.  Patient is a 36 y.o. female presenting with abdominal pain. The history is provided by the patient and the spouse.  Abdominal Pain The primary symptoms of the illness include abdominal pain.    Past Medical History  Diagnosis Date  . Hypertension   . Kidney stone     Past Surgical History  Procedure Date  . Lithotripsy     No family history on file.  History  Substance Use Topics  . Smoking status: Former Games developer  . Smokeless tobacco: Not on file  . Alcohol Use: No    OB History    Grav Para Term Preterm Abortions TAB SAB Ect Mult Living                  Review of Systems  Gastrointestinal: Positive for abdominal pain.  All other systems reviewed and are negative.    Allergies  Review of patient's allergies indicates no known allergies.  Home Medications   Current Outpatient Rx    Name Route Sig Dispense Refill  . LEVONORGESTREL 20 MCG/24HR IU IUD Intrauterine 1 each by Intrauterine route once.    . CEPHALEXIN 500 MG PO CAPS Oral Take 1 capsule (500 mg total) by mouth 3 (three) times daily. 21 capsule 0  . ONDANSETRON 4 MG PO TBDP Oral Take 1 tablet (4 mg total) by mouth every 8 (eight) hours as needed for nausea. 10 tablet 0    BP 128/77  Pulse 100  Temp 99.2 F (37.3 C) (Oral)  Resp 17  SpO2 98%  Physical Exam  Nursing note and vitals reviewed. Constitutional: She appears well-developed and well-nourished. No distress.  HENT:  Head: Normocephalic and atraumatic.  Mouth/Throat: Oropharynx is clear and moist. No oropharyngeal exudate.  Eyes: Conjunctivae and EOM are normal. Pupils are equal, round, and reactive to light. Right eye exhibits no discharge. Left eye exhibits no discharge. No scleral icterus.  Neck: Normal range of motion. Neck supple. No JVD present. No thyromegaly present.  Cardiovascular: Normal rate, regular rhythm, normal heart sounds and intact distal pulses.  Exam reveals no gallop and no friction rub.   No murmur heard. Pulmonary/Chest: Effort normal and breath sounds normal. No respiratory distress. She has no wheezes. She has no rales.  Abdominal: Soft. Bowel sounds are normal. She exhibits no distension and no mass. There is no tenderness.  Musculoskeletal: Normal range of motion. She exhibits no edema and no tenderness.  Lymphadenopathy:  She has no cervical adenopathy.  Neurological: She is alert. Coordination normal.  Skin: Skin is warm and dry. No rash noted. No erythema.  Psychiatric: She has a normal mood and affect. Her behavior is normal.    ED Course  Procedures (including critical care time)  Labs Reviewed  URINALYSIS, ROUTINE W REFLEX MICROSCOPIC - Abnormal; Notable for the following:    Color, Urine AMBER (*)  BIOCHEMICALS MAY BE AFFECTED BY COLOR   APPearance CLOUDY (*)     Hgb urine dipstick LARGE (*)      Bilirubin Urine SMALL (*)     Ketones, ur 15 (*)     Protein, ur 100 (*)     Leukocytes, UA SMALL (*)     All other components within normal limits  CBC WITH DIFFERENTIAL - Abnormal; Notable for the following:    WBC 19.3 (*)     Hemoglobin 15.1 (*)     Neutrophils Relative 85 (*)     Neutro Abs 16.3 (*)     Lymphocytes Relative 7 (*)     Monocytes Absolute 1.6 (*)     All other components within normal limits  COMPREHENSIVE METABOLIC PANEL - Abnormal; Notable for the following:    Potassium 3.3 (*)     Glucose, Bld 153 (*)     All other components within normal limits  URINE MICROSCOPIC-ADD ON - Abnormal; Notable for the following:    Squamous Epithelial / LPF MANY (*)     Bacteria, UA FEW (*)     All other components within normal limits  PREGNANCY, URINE  URINE CULTURE   No results found.   1. Cystitis   2. Leukocytosis       MDM  On my exam the patient does not have a tachycardia, her pulse is 95, her abdomen is soft and nontender, there is no guarding nor is there CVA tenderness. Her fever has defervesced after receiving Tylenol. She has agreed to intramuscular Rocephin and discharged with Keflex. I have requested a urine culture secondary to her high fevers in 4 days of symptoms however this time she does not have any systemic symptoms that would warrant admission. Her white blood cell count is elevated at 19,000 and I asked her to have this rechecked within the week. She has been given resource list, Zofran and Keflex prescriptions and has expressed her understanding to the indications for return.        Vida Roller, MD 02/29/12 [phone]

## 2012-02-29 NOTE — ED Notes (Signed)
Pt states that she has been having problems with her bladder. Pt states that it feels like pressure in her bladder and lower pelvic area. Pt states that she feels like she has too go it doesn't want to come out. Pt states it feels like stagnant when it does come out. Pt denies incontinence.

## 2012-03-02 LAB — URINE CULTURE: Colony Count: >=100000

## 2013-12-23 ENCOUNTER — Emergency Department (HOSPITAL_COMMUNITY)
Admission: EM | Admit: 2013-12-23 | Discharge: 2013-12-23 | Disposition: A | Discharge status: Home / Self Care | Payer: Medicaid Other | Attending: Emergency Medicine | Admitting: Emergency Medicine

## 2013-12-23 ENCOUNTER — Encounter (HOSPITAL_COMMUNITY): Payer: Self-pay | Admitting: Emergency Medicine

## 2013-12-23 DIAGNOSIS — I10 Essential (primary) hypertension: Secondary | ICD-10-CM | POA: Diagnosis not present

## 2013-12-23 DIAGNOSIS — S6990XA Unspecified injury of unspecified wrist, hand and finger(s), initial encounter: Secondary | ICD-10-CM | POA: Diagnosis present

## 2013-12-23 DIAGNOSIS — Y929 Unspecified place or not applicable: Secondary | ICD-10-CM | POA: Insufficient documentation

## 2013-12-23 DIAGNOSIS — Z87442 Personal history of urinary calculi: Secondary | ICD-10-CM | POA: Diagnosis not present

## 2013-12-23 DIAGNOSIS — S61209A Unspecified open wound of unspecified finger without damage to nail, initial encounter: Secondary | ICD-10-CM | POA: Insufficient documentation

## 2013-12-23 DIAGNOSIS — Z87891 Personal history of nicotine dependence: Secondary | ICD-10-CM | POA: Diagnosis not present

## 2013-12-23 DIAGNOSIS — W268XXA Contact with other sharp object(s), not elsewhere classified, initial encounter: Secondary | ICD-10-CM | POA: Insufficient documentation

## 2013-12-23 DIAGNOSIS — Y9389 Activity, other specified: Secondary | ICD-10-CM | POA: Insufficient documentation

## 2013-12-23 DIAGNOSIS — S61219A Laceration without foreign body of unspecified finger without damage to nail, initial encounter: Secondary | ICD-10-CM

## 2013-12-23 DIAGNOSIS — S61409A Unspecified open wound of unspecified hand, initial encounter: Secondary | ICD-10-CM | POA: Diagnosis not present

## 2013-12-23 DIAGNOSIS — S6980XA Other specified injuries of unspecified wrist, hand and finger(s), initial encounter: Secondary | ICD-10-CM | POA: Diagnosis present

## 2013-12-23 DIAGNOSIS — Z8709 Personal history of other diseases of the respiratory system: Secondary | ICD-10-CM | POA: Insufficient documentation

## 2013-12-23 DIAGNOSIS — Z79899 Other long term (current) drug therapy: Secondary | ICD-10-CM | POA: Diagnosis not present

## 2013-12-23 DIAGNOSIS — S61411A Laceration without foreign body of right hand, initial encounter: Secondary | ICD-10-CM

## 2013-12-23 HISTORY — DX: Other specified symptoms and signs involving the circulatory and respiratory systems: R09.89

## 2013-12-23 MED ORDER — OXYCODONE-ACETAMINOPHEN 5-325 MG PO TABS
1.0000 | ORAL_TABLET | Freq: Once | ORAL | Status: AC
  Administered 2013-12-23: 1 via ORAL
  Filled 2013-12-23: qty 1

## 2013-12-23 MED ORDER — CEPHALEXIN 500 MG PO CAPS: 500.0000 mg | ORAL_CAPSULE | Freq: Four times a day (QID) | ORAL | Status: DC

## 2013-12-23 NOTE — ED Provider Notes (Signed)
CSN: [id number]     Arrival date & time 12/23/13  1447 History  This chart was scribed for non-physician practitioner Trixie DredgeEmily West, PA-C working with Rolland PorterMark James, MD by Joaquin MusicKristina Sanchez-Matthews, ED Scribe. This patient was seen in room TR06C/TR06C and the patient's care was started at 4:06 PM .   Chief Complaint  Patient presents with  . Extremity Laceration   The history is provided by the patient. No language interpreter was used.   HPI Comments: Rebekah Wright is a 38 y.o. female who presents to the Emergency Department R hand laceration that occurred 2 hours ago. Pt states she was moving a lazy-boy couch with a metal bottom and was cut by a piece of metal. Describes the pain as throbbing; denies having furniture fall onto R hand. She is R hand dominant. Last Tetanus was in 2014. Denies numbness, loss of sensation to R hand, and possibility of foreign bodies (metal pieces did not break).  Past Medical History  Diagnosis Date  . Hypertension   . Kidney stone   . Tonsil pain    Past Surgical History  Procedure Laterality Date  . Lithotripsy     History reviewed. No pertinent family history. History  Substance Use Topics  . Smoking status: Former Games developermoker  . Smokeless tobacco: Not on file  . Alcohol Use: No   OB History   Grav Para Term Preterm Abortions TAB SAB Ect Mult Living                 Review of Systems  Skin: Positive for wound. Negative for color change.  Neurological: Negative for weakness and numbness.    Allergies  Review of patient's allergies indicates no known allergies.  Home Medications   Prior to Admission medications   Medication Sig Start Date End Date Taking? Authorizing Provider  levonorgestrel (MIRENA) 20 MCG/24HR IUD 1 each by Intrauterine route once.   Yes Historical Provider, MD  losartan (COZAAR) 100 MG tablet Take 100 mg by mouth daily.   Yes Historical Provider, MD   BP 137/96  Pulse 91  Temp(Src) 98.7 F (37.1 C) (Oral)  SpO2  99%  Physical Exam  Nursing note and vitals reviewed. Constitutional: She is oriented to person, place, and time. She appears well-developed and well-nourished.  HENT:  Head: Normocephalic and atraumatic.  Eyes: EOM are normal.  Neck: Normal range of motion.  Cardiovascular: Normal rate.   Capillary refill is less than 2 seconds.  Pulmonary/Chest: Effort normal.  Musculoskeletal: Normal range of motion.       Right wrist: Normal.       Right forearm: Normal.       Right hand: She exhibits laceration. She exhibits normal range of motion, no bony tenderness, normal capillary refill, no deformity and no swelling. Normal sensation noted. Normal strength noted.  Neurological: She is alert and oriented to person, place, and time.  Sensation is intact.  Skin: Skin is warm and dry.  2 cm laceration over the 2nd MCP over the dorsal aspect. Abrasion along th digit. Superficial flap over the distal aspect of the 2nd digit.  Psychiatric: She has a normal mood and affect. Her behavior is normal.   ED Course  Procedures (including critical care time) DIAGNOSTIC STUDIES: Oxygen Saturation is 99% on RA, normal by my interpretation.    COORDINATION OF CARE: 4:09 PM-Discussed treatment plan which includes laceration repair. Pt agreed to plan.   4:16 PM- LACERATION REPAIR Performed by: Trixie DredgeEmily West, PA-C  Performed by  Suzzette Righterourtney Scott, PA-S, under my supervision.  Consent: Verbal consent obtained. Risks and benefits: risks, benefits and alternatives were discussed Patient identity confirmed: provided demographic data Time out performed prior to procedure Prepped and Draped in normal sterile fashion Wound explored Laceration Location: R 2nd digit, over dorsal MCP Laceration Length: 2 cm No Foreign Bodies seen or palpated Anesthesia: local infiltration Local anesthetic: lidocaine 2% without epinephrine Anesthetic total: 0.5 ml Irrigation method: syringe Amount of cleaning: standard Skin closure:  5.0 Vicryl Number of sutures: 5 Technique: Simple interrupted  Patient tolerance: Patient tolerated the procedure well with no immediate complications.  4:18 PM- LACERATION REPAIR Performed by: Trixie DredgeEmily West, PA-C  Performed by Suzzette Righterourtney Scott, PA-S, under my supervision.  Consent: Verbal consent obtained. Risks and benefits: risks, benefits and alternatives were discussed Patient identity confirmed: provided demographic data Time out performed prior to procedure Prepped and Draped in normal sterile fashion Wound explored Laceration Location: R 2nd digit, distal Laceration Length: 1 cm No Foreign Bodies seen or palpated Anesthesia: digital block Local anesthetic: lidocaine 2% without epinephrine Anesthetic total: 3mL Irrigation method: syringe Amount of cleaning: standard Skin closure: 5.0 Vicrolyl  Number of sutures: 3 Technique: Simple interrupted  Patient tolerance: Patient tolerated the procedure well with no immediate complications.  4:19 PM- Digital Block Performed by: Trixie DredgeEmily West, PA-C Authorized by: Trixie DredgeEmily West, PA-C Consent: Verbal consent obtained. Patient understanding: patient states understanding of the procedure being performed Patient identity confirmed: verbally with patient Local anesthesia used: yes Local anesthetic: 2% Xylocaine without epinephrine  Anesthetic total: 3 ml Patient sedated: no Patient tolerance: Patient tolerated the procedure well with no immediate complications.   Labs Review Labs Reviewed - No data to display  Imaging Review No results found.   EKG Interpretation None     MDM   Final diagnoses:  Hand laceration, right, initial encounter  Finger laceration, initial encounter  Avulsion of skin of finger, initial encounter   Pt with two lacerations and superficial skin avulsion of right hand, second digit.  Neurovascularly intact.  No tendon involvement noted.  Lacerations repaired in ED.  D/C home with keflex to prevent infection.   Discussed home wound care with patient.  Discussedfindings, treatment, and follow up  with patient.  Pt given return precautions.  Pt verbalizes understanding and agrees with plan.      I personally performed the services described in this documentation, which was scribed in my presence. The recorded information has been reviewed and is accurate.    Trixie Dredgemily West, PA-C 12/23/13 1736

## 2013-12-23 NOTE — Discharge Instructions (Signed)
Read the information below.  Use the prescribed medication as directed.  Please discuss all new medications with your pharmacist.  You may return to the Emergency Department at any time for worsening condition or any new symptoms that concern you.  If you develop redness, swelling, pus draining from the wound, or fevers greater than 100.4, return to the ER immediately for a recheck.     Laceration Care, Adult A laceration is a cut or lesion that goes through all layers of the skin and into the tissue just beneath the skin. TREATMENT  Some lacerations may not require closure. Some lacerations may not be able to be closed due to an increased risk of infection. It is important to see your caregiver as soon as possible after an injury to minimize the risk of infection and maximize the opportunity for successful closure. If closure is appropriate, pain medicines may be given, if needed. The wound will be cleaned to help prevent infection. Your caregiver will use stitches (sutures), staples, wound glue (adhesive), or skin adhesive strips to repair the laceration. These tools bring the skin edges together to allow for faster healing and a better cosmetic outcome. However, all wounds will heal with a scar. Once the wound has healed, scarring can be minimized by covering the wound with sunscreen during the day for 1 full year. HOME CARE INSTRUCTIONS  For sutures or staples:  Keep the wound clean and dry.  If you were given a bandage (dressing), you should change it at least once a day. Also, change the dressing if it becomes wet or dirty, or as directed by your caregiver.  Wash the wound with soap and water 2 times a day. Rinse the wound off with water to remove all soap. Pat the wound dry with a clean towel.  After cleaning, apply a thin layer of the antibiotic ointment as recommended by your caregiver. This will help prevent infection and keep the dressing from sticking.  You may shower as usual after  the first 24 hours. Do not soak the wound in water until the sutures are removed.  Only take over-the-counter or prescription medicines for pain, discomfort, or fever as directed by your caregiver.  Get your sutures or staples removed as directed by your caregiver. For skin adhesive strips:  Keep the wound clean and dry.  Do not get the skin adhesive strips wet. You may bathe carefully, using caution to keep the wound dry.  If the wound gets wet, pat it dry with a clean towel.  Skin adhesive strips will fall off on their own. You may trim the strips as the wound heals. Do not remove skin adhesive strips that are still stuck to the wound. They will fall off in time. For wound adhesive:  You may briefly wet your wound in the shower or bath. Do not soak or scrub the wound. Do not swim. Avoid periods of heavy perspiration until the skin adhesive has fallen off on its own. After showering or bathing, gently pat the wound dry with a clean towel.  Do not apply liquid medicine, cream medicine, or ointment medicine to your wound while the skin adhesive is in place. This may loosen the film before your wound is healed.  If a dressing is placed over the wound, be careful not to apply tape directly over the skin adhesive. This may cause the adhesive to be pulled off before the wound is healed.  Avoid prolonged exposure to sunlight or tanning lamps while the  skin adhesive is in place. Exposure to ultraviolet light in the first year will darken the scar.  The skin adhesive will usually remain in place for 5 to 10 days, then naturally fall off the skin. Do not pick at the adhesive film. You may need a tetanus shot if:  You cannot remember when you had your last tetanus shot.  You have never had a tetanus shot. If you get a tetanus shot, your arm may swell, get red, and feel warm to the touch. This is common and not a problem. If you need a tetanus shot and you choose not to have one, there is a rare  chance of getting tetanus. Sickness from tetanus can be serious. SEEK MEDICAL CARE IF:   You have redness, swelling, or increasing pain in the wound.  You see a red line that goes away from the wound.  You have yellowish-white fluid (pus) coming from the wound.  You have a fever.  You notice a bad smell coming from the wound or dressing.  Your wound breaks open before or after sutures have been removed.  You notice something coming out of the wound such as wood or glass.  Your wound is on your hand or foot and you cannot move a finger or toe. SEEK IMMEDIATE MEDICAL CARE IF:   Your pain is not controlled with prescribed medicine.  You have severe swelling around the wound causing pain and numbness or a change in color in your arm, hand, leg, or foot.  Your wound splits open and starts bleeding.  You have worsening numbness, weakness, or loss of function of any joint around or beyond the wound.  You develop painful lumps near the wound or on the skin anywhere on your body. MAKE SURE YOU:   Understand these instructions.  Will watch your condition.  Will get help right away if you are not doing well or get worse. Document Released: 06/17/2005 Document Revised: 09/09/2011 Document Reviewed: 12/11/2010 [facility] Patient Information 2015 [employer], [place]. This information is not intended to replace advice given to you by your health care provider. Make sure you discuss any questions you have with your health care provider.  Abrasion An abrasion is a cut or scrape of the skin. Abrasions do not extend through all layers of the skin and most heal within 10 days. It is important to care for your abrasion properly to prevent infection. CAUSES  Most abrasions are caused by falling on, or gliding across, the ground or other surface. When your skin rubs on something, the outer and inner layer of skin rubs off, causing an abrasion. DIAGNOSIS  Your caregiver will be able to diagnose an  abrasion during a physical exam.  TREATMENT  Your treatment depends on how large and deep the abrasion is. Generally, your abrasion will be cleaned with water and a mild soap to remove any dirt or debris. An antibiotic ointment may be put over the abrasion to prevent an infection. A bandage (dressing) may be wrapped around the abrasion to keep it from getting dirty.  You may need a tetanus shot if:  You cannot remember when you had your last tetanus shot.  You have never had a tetanus shot.  The injury broke your skin. If you get a tetanus shot, your arm may swell, get red, and feel warm to the touch. This is common and not a problem. If you need a tetanus shot and you choose not to have one, there is a rare  chance of getting tetanus. Sickness from tetanus can be serious.  HOME CARE INSTRUCTIONS   If a dressing was applied, change it at least once a day or as directed by your caregiver. If the bandage sticks, soak it off with warm water.   Wash the area with water and a mild soap to remove all the ointment 2 times a day. Rinse off the soap and pat the area dry with a clean towel.   Reapply any ointment as directed by your caregiver. This will help prevent infection and keep the bandage from sticking. Use gauze over the wound and under the dressing to help keep the bandage from sticking.   Change your dressing right away if it becomes wet or dirty.   Only take over-the-counter or prescription medicines for pain, discomfort, or fever as directed by your caregiver.   Follow up with your caregiver within 24-48 hours for a wound check, or as directed. If you were not given a wound-check appointment, look closely at your abrasion for redness, swelling, or pus. These are signs of infection. SEEK IMMEDIATE MEDICAL CARE IF:   You have increasing pain in the wound.   You have redness, swelling, or tenderness around the wound.   You have pus coming from the wound.   You have a fever or  persistent symptoms for more than 2-3 days.  You have a fever and your symptoms suddenly get worse.  You have a bad smell coming from the wound or dressing.  MAKE SURE YOU:   Understand these instructions.  Will watch your condition.  Will get help right away if you are not doing well or get worse. Document Released: 03/27/2005 Document Revised: 06/03/2012 Document Reviewed: 05/21/2011 [facility] Patient Information 2015 [place], [place]. This information is not intended to replace advice given to you by your health care provider. Make sure you discuss any questions you have with your health care provider.

## 2013-12-23 NOTE — ED Notes (Signed)
Declined W/C at D/C and was escorted to lobby by RN. 

## 2013-12-23 NOTE — ED Notes (Signed)
Pt reports cutting her hand on a chair. Last reported Tdap 2014.

## 2013-12-27 NOTE — ED Provider Notes (Signed)
Medical screening examination/treatment/procedure(s) were performed by non-physician practitioner and as supervising physician I was immediately available for consultation/collaboration.   EKG Interpretation None        Mark James, MD 12/27/13 2143 

## 2016-08-03 ENCOUNTER — Emergency Department (HOSPITAL_COMMUNITY)
Admission: EM | Admit: 2016-08-03 | Discharge: 2016-08-04 | Disposition: A | Discharge status: Home / Self Care | Payer: No Typology Code available for payment source | Attending: Emergency Medicine | Admitting: Emergency Medicine

## 2016-08-03 ENCOUNTER — Encounter (HOSPITAL_COMMUNITY): Payer: Self-pay

## 2016-08-03 DIAGNOSIS — R1030 Lower abdominal pain, unspecified: Secondary | ICD-10-CM | POA: Diagnosis not present

## 2016-08-03 DIAGNOSIS — R102 Pelvic and perineal pain: Secondary | ICD-10-CM | POA: Insufficient documentation

## 2016-08-03 DIAGNOSIS — I10 Essential (primary) hypertension: Secondary | ICD-10-CM | POA: Diagnosis not present

## 2016-08-03 DIAGNOSIS — Z87891 Personal history of nicotine dependence: Secondary | ICD-10-CM | POA: Diagnosis not present

## 2016-08-03 LAB — URINALYSIS, ROUTINE W REFLEX MICROSCOPIC
Bilirubin Urine: NEGATIVE
Glucose, UA: NEGATIVE mg/dL
Ketones, ur: NEGATIVE mg/dL
Nitrite: NEGATIVE
PH: 5 (ref 5.0–8.0)
PROTEIN: 30 mg/dL — AB
Specific Gravity, Urine: 1.029 (ref 1.005–1.030)

## 2016-08-03 LAB — COMPREHENSIVE METABOLIC PANEL
ALT: 27 U/L (ref 14–54)
AST: 26 U/L (ref 15–41)
Albumin: 4.2 g/dL (ref 3.5–5.0)
Alkaline Phosphatase: 64 U/L (ref 38–126)
Anion gap: 15 (ref 5–15)
BUN: 10 mg/dL (ref 6–20)
CHLORIDE: 101 mmol/L (ref 101–111)
CO2: 24 mmol/L (ref 22–32)
CREATININE: 0.86 mg/dL (ref 0.44–1.00)
Calcium: 9.3 mg/dL (ref 8.9–10.3)
GFR calc Af Amer: >60 mL/min (ref >60)
GFR calc non Af Amer: >60 mL/min (ref >60)
Glucose, Bld: 120 mg/dL — ABNORMAL HIGH (ref 65–99)
POTASSIUM: 3.4 mmol/L — AB (ref 3.5–5.1)
SODIUM: 140 mmol/L (ref 135–145)
Total Bilirubin: 0.6 mg/dL (ref 0.3–1.2)
Total Protein: 6.8 g/dL (ref 6.5–8.1)

## 2016-08-03 LAB — CBC
HEMATOCRIT: 47.6 % — AB (ref 36.0–46.0)
Hemoglobin: 16.6 g/dL — ABNORMAL HIGH (ref 12.0–15.0)
MCH: 32.3 pg (ref 26.0–34.0)
MCHC: 34.9 g/dL (ref 30.0–36.0)
MCV: 92.6 fL (ref 78.0–100.0)
PLATELETS: 224 10*3/uL (ref 150–400)
RBC: 5.14 MIL/uL — ABNORMAL HIGH (ref 3.87–5.11)
RDW: 12.9 % (ref 11.5–15.5)
WBC: 14 10*3/uL — ABNORMAL HIGH (ref 4.0–10.5)

## 2016-08-03 LAB — HCG, QUANTITATIVE, PREGNANCY

## 2016-08-03 LAB — LIPASE, BLOOD: LIPASE: 30 U/L (ref 11–51)

## 2016-08-03 MED ORDER — SODIUM CHLORIDE 0.9 % IV BOLUS (SEPSIS)
1000.0000 mL | Freq: Once | INTRAVENOUS | Status: AC
  Administered 2016-08-03: 1000 mL via INTRAVENOUS

## 2016-08-03 MED ORDER — ONDANSETRON 4 MG PO TBDP
4.0000 mg | ORAL_TABLET | Freq: Once | ORAL | Status: AC | PRN
  Administered 2016-08-03: 4 mg via ORAL

## 2016-08-03 MED ORDER — ONDANSETRON 4 MG PO TBDP
ORAL_TABLET | ORAL | Status: AC
  Filled 2016-08-03: qty 1

## 2016-08-03 NOTE — ED Notes (Signed)
P[t here with nv  Since yesterday  She had an elevated temp all day

## 2016-08-03 NOTE — ED Provider Notes (Signed)
[facility]-EMERGENCY DEPT Provider Note   CSN: [id number] Arrival date & time: 08/03/16  2046   By signing my name below, I, Clovis PuAvnee Patel, attest that this documentation has been prepared under the direction and in the presence of Gilda Creasehristopher J Pollina, MD  Electronically Signed: Clovis PuAvnee Patel, ED Scribe. 08/03/16. 11:23 PM.  History   Chief Complaint Chief Complaint  Patient presents with  . Abdominal Pain    The history is provided by the patient. No language interpreter was used.   HPI Comments:  Rebekah Wright is a 41 y.o. female, with a hx of ovarian cysts, who presents to the Emergency Department complaining of pelvic pain which radiates to onset today. Her pain is worse upon palpation. Pt also reports intermittent nausea x yesterday and 1 episode of vomiting today. She has had Zofran in the ED with relief. Pt denies urinary symptoms, diarrhea, hematochezia, vaginal discharge, vaginal bleeding, a hx of colitis or a hx of similar symptoms.   Past Medical History:  Diagnosis Date  . Hypertension   . Kidney stone   . Tonsil pain     There are no active problems to display for this patient.   Past Surgical History:  Procedure Laterality Date  . LITHOTRIPSY      OB History    No data available       Home Medications    Prior to Admission medications   Medication Sig Start Date End Date Taking? Authorizing Provider  cephALEXin (KEFLEX) 500 MG capsule Take 1 capsule (500 mg total) by mouth 4 (four) times daily. 12/23/13   Trixie DredgeEmily West, PA-C  dicyclomine (BENTYL) 20 MG tablet Take 1 tablet (20 mg total) by mouth 2 (two) times daily. 08/04/16   Gilda Creasehristopher J Pollina, MD  levonorgestrel (MIRENA) 20 MCG/24HR IUD 1 each by Intrauterine route once.    Historical Provider, MD  losartan (COZAAR) 100 MG tablet Take 100 mg by mouth daily.    Historical Provider, MD  ondansetron (ZOFRAN) 4 MG tablet Take 1 tablet (4 mg total) by mouth every 6 (six) hours. 08/04/16   Gilda Creasehristopher J Pollina,  MD  traMADol (ULTRAM) 50 MG tablet Take 1 tablet (50 mg total) by mouth every 6 (six) hours as needed. 08/04/16   Gilda Creasehristopher J Pollina, MD    Family History History reviewed. No pertinent family history.  Social History Social History  Substance Use Topics  . Smoking status: Former Games developermoker  . Smokeless tobacco: Never Used  . Alcohol use No     Allergies   Patient has no known allergies.   Review of Systems Review of Systems  Gastrointestinal: Positive for nausea and vomiting. Negative for blood in stool and diarrhea.  Genitourinary: Positive for pelvic pain. Negative for dysuria, vaginal bleeding and vaginal discharge.  All other systems reviewed and are negative.    Physical Exam Updated Vital Signs BP 108/78 (BP Location: Right Arm)   Pulse 85   Temp 98.4 F (36.9 C) (Oral)   Resp 18   Ht 5\' 5"  (1.651 m)   Wt 184 lb (83.5 kg)   SpO2 96%   BMI 30.62 kg/m   Physical Exam  Constitutional: She is oriented to person, place, and time. She appears well-developed and well-nourished. No distress.  HENT:  Head: Normocephalic and atraumatic.  Right Ear: Hearing normal.  Left Ear: Hearing normal.  Nose: Nose normal.  Mouth/Throat: Oropharynx is clear and moist and mucous membranes are normal.  Eyes: Conjunctivae and EOM are normal. Pupils are  equal, round, and reactive to light.  Neck: Normal range of motion. Neck supple.  Cardiovascular: Regular rhythm, S1 normal and S2 normal.  Exam reveals no gallop and no friction rub.   No murmur heard. Pulmonary/Chest: Effort normal and breath sounds normal. No respiratory distress. She exhibits no tenderness.  Abdominal: Soft. Normal appearance and bowel sounds are normal. There is no hepatosplenomegaly. There is no tenderness. There is no rebound, no guarding, no tenderness at McBurney's point and negative Murphy's sign. No hernia.  Musculoskeletal: Normal range of motion. She exhibits tenderness.  Diffuse pelvic tenderness  (left>right)  Neurological: She is alert and oriented to person, place, and time. She has normal strength. No cranial nerve deficit or sensory deficit. Coordination normal. GCS eye subscore is 4. GCS verbal subscore is 5. GCS motor subscore is 6.  Skin: Skin is warm, dry and intact. No rash noted. No cyanosis.  Psychiatric: She has a normal mood and affect. Her speech is normal and behavior is normal. Thought content normal.  Nursing note and vitals reviewed.    ED Treatments / Results  DIAGNOSTIC STUDIES:  Oxygen Saturation is 99% on RA, normal by my interpretation.    COORDINATION OF CARE:  11:06 PM Discussed treatment plan with pt at bedside and pt agreed to plan.  Labs (all labs ordered are listed, but only abnormal results are displayed) Labs Reviewed  COMPREHENSIVE METABOLIC PANEL - Abnormal; Notable for the following:       Result Value   Potassium 3.4 (*)    Glucose, Bld 120 (*)    All other components within normal limits  CBC - Abnormal; Notable for the following:    WBC 14.0 (*)    RBC 5.14 (*)    Hemoglobin 16.6 (*)    HCT 47.6 (*)    All other components within normal limits  URINALYSIS, ROUTINE W REFLEX MICROSCOPIC - Abnormal; Notable for the following:    APPearance CLOUDY (*)    Hgb urine dipstick MODERATE (*)    Protein, ur 30 (*)    Leukocytes, UA TRACE (*)    Bacteria, UA MANY (*)    Squamous Epithelial / LPF 6-30 (*)    All other components within normal limits  LIPASE, BLOOD  HCG, QUANTITATIVE, PREGNANCY    EKG  EKG Interpretation None       Radiology Ct Abdomen Pelvis W Contrast  Result Date: 08/04/2016 CLINICAL DATA:  Lower abdominal pain today.  Nausea and vomiting. EXAM: CT ABDOMEN AND PELVIS WITH CONTRAST TECHNIQUE: Multidetector CT imaging of the abdomen and pelvis was performed using the standard protocol following bolus administration of intravenous contrast. CONTRAST:  ISOVUE-300 IOPAMIDOL (ISOVUE-300) INJECTION 61% COMPARISON:   CT abdomen/pelvis 02/16/2009 FINDINGS: Lower chest: Mild dependent atelectasis.  No pleural fluid. Hepatobiliary: No focal liver abnormality is seen. No gallstones, gallbladder wall thickening, or biliary dilatation. Pancreas: No ductal dilatation or inflammation. Spleen: Upper limits of normal in size, elongated. Adrenals/Urinary Tract: Horseshoe kidney configuration. No hydronephrosis. Nonobstructing stone in the left kidney. No perinephric edema. Tiny cortical hypodensity in the upper right kidney. Well-circumscribed left adrenal nodule measures 19 mm. The right adrenal gland is normal. Urinary bladder is minimally distended. No evidence of bladder wall thickening. Stomach/Bowel: Small hiatal hernia. Stomach is otherwise within normal limits. Appendix appears normal. No evidence of bowel wall thickening, distention, or inflammatory changes. Liquid stool in the ascending colon but no associated inflammatory change. Vascular/Lymphatic: No significant vascular findings are present. No enlarged abdominal or pelvic lymph  nodes. Reproductive: Intrauterine device in the uterus. Ovaries symmetric and normal in appearance. Other: No free air, free fluid, or intra-abdominal fluid collection. Musculoskeletal: There are no acute or suspicious osseous abnormalities. Vague increased density about the right iliac crest is unchanged from prior exam. IMPRESSION: 1. No acute abnormality or explanation for lower abdominal pain. 2. Horseshoe kidney with nonobstructing left nephrolithiasis. 3. Left adrenal nodule measuring 19 mm, new from most recent comparison exam seven years prior. There are no suspicious characteristics. Consider 12 month follow-up adrenal CT. 4. Liquid stool in the ascending colon, but no colonic inflammation. Electronically Signed   By: Rubye Oaks M.D.   On: 08/04/2016 01:01    Procedures Procedures (including critical care time)  Medications Ordered in ED Medications  ondansetron (ZOFRAN-ODT) 4  MG disintegrating tablet (not administered)  ondansetron (ZOFRAN-ODT) disintegrating tablet 4 mg (4 mg Oral Given 08/03/16 2114)  sodium chloride 0.9 % bolus 1,000 mL (0 mLs Intravenous Stopped 08/04/16 0100)  iopamidol (ISOVUE-300) 61 % injection (100 mLs  Contrast Given 08/04/16 0013)     Initial Impression / Assessment and Plan / ED Course  I have reviewed the triage vital signs and the nursing notes.  Pertinent labs & imaging results that were available during my care of the patient were reviewed by me and considered in my medical decision making (see chart for details).     Patient presents to the emergency department for evaluation of abdominal pain. Patient has been experiencing diffuse bilateral lower abdominal pain, cramping with nausea and has had one episode of vomiting. She has not had diarrhea or constipation. No urinary symptoms. No gynecologic symptoms. Patient did have a leukocytosis on her lab work, otherwise unremarkable. No sign of urinary tract infection. Examination did reveal diffuse tenderness in the lower abdomen, predominantly on the left side. CT abdomen and pelvis performed to further evaluate. No acute pathology is noted. Patient reassured, will treat with analgesia, antiemetics. Follow-up with primary doctor.  Final Clinical Impressions(s) / ED Diagnoses   Final diagnoses:  Lower abdominal pain    New Prescriptions New Prescriptions   DICYCLOMINE (BENTYL) 20 MG TABLET    Take 1 tablet (20 mg total) by mouth 2 (two) times daily.   ONDANSETRON (ZOFRAN) 4 MG TABLET    Take 1 tablet (4 mg total) by mouth every 6 (six) hours.   TRAMADOL (ULTRAM) 50 MG TABLET    Take 1 tablet (50 mg total) by mouth every 6 (six) hours as needed.  I personally performed the services described in this documentation, which was scribed in my presence. The recorded information has been reviewed and is accurate.     Gilda Crease, MD 08/04/16 [phone]

## 2016-08-03 NOTE — ED Triage Notes (Signed)
Pt endorses lower mid abdominal pain with n/v and fever that began this morning. Pt had 1 episode of vomiting. Denies diarrhea. Pt last took ibuprofen 800mg  at 1900 for fever of 100.1. Oral temp here 99.2.

## 2016-08-03 NOTE — ED Notes (Signed)
Iv started.

## 2016-08-04 ENCOUNTER — Encounter (HOSPITAL_COMMUNITY): Payer: Self-pay | Admitting: Radiology

## 2016-08-04 ENCOUNTER — Emergency Department (HOSPITAL_COMMUNITY): Payer: No Typology Code available for payment source

## 2016-08-04 MED ORDER — DICYCLOMINE HCL 20 MG PO TABS: 20.0000 mg | ORAL_TABLET | Freq: Two times a day (BID) | ORAL | 0 refills | Status: DC

## 2016-08-04 MED ORDER — IOPAMIDOL (ISOVUE-300) INJECTION 61%
INTRAVENOUS | Status: AC
  Administered 2016-08-04: 100 mL
  Filled 2016-08-04: qty 100

## 2016-08-04 MED ORDER — TRAMADOL HCL 50 MG PO TABS: 50.0000 mg | ORAL_TABLET | Freq: Four times a day (QID) | ORAL | 0 refills | Status: DC | PRN

## 2016-08-04 MED ORDER — ONDANSETRON HCL 4 MG PO TABS: 4.0000 mg | ORAL_TABLET | Freq: Four times a day (QID) | ORAL | 0 refills | Status: DC

## 2016-08-04 NOTE — ED Notes (Signed)
To ct

## 2016-08-04 NOTE — ED Notes (Signed)
Pt returned from c-t  Liter of nss  infused

## 2018-04-07 ENCOUNTER — Encounter: Payer: Self-pay | Admitting: Obstetrics and Gynecology

## 2018-04-07 ENCOUNTER — Ambulatory Visit (INDEPENDENT_AMBULATORY_CARE_PROVIDER_SITE_OTHER): Payer: Medicaid Other | Admitting: Obstetrics and Gynecology

## 2018-04-07 ENCOUNTER — Other Ambulatory Visit (HOSPITAL_COMMUNITY)
Admission: RE | Admit: 2018-04-07 | Discharge: 2018-04-07 | Disposition: A | Discharge status: Home / Self Care | Payer: Medicaid Other | Source: Ambulatory Visit | Attending: Obstetrics and Gynecology | Admitting: Obstetrics and Gynecology

## 2018-04-07 VITALS — BP 119/84 | HR 86 | Ht 65.0 in | Wt 197.0 lb

## 2018-04-07 DIAGNOSIS — I10 Essential (primary) hypertension: Secondary | ICD-10-CM | POA: Insufficient documentation

## 2018-04-07 DIAGNOSIS — Z975 Presence of (intrauterine) contraceptive device: Secondary | ICD-10-CM | POA: Diagnosis not present

## 2018-04-07 DIAGNOSIS — Z01419 Encounter for gynecological examination (general) (routine) without abnormal findings: Secondary | ICD-10-CM | POA: Insufficient documentation

## 2018-04-07 DIAGNOSIS — F17200 Nicotine dependence, unspecified, uncomplicated: Secondary | ICD-10-CM | POA: Diagnosis not present

## 2018-04-07 DIAGNOSIS — Z30432 Encounter for removal of intrauterine contraceptive device: Secondary | ICD-10-CM | POA: Insufficient documentation

## 2018-04-07 DIAGNOSIS — Z79899 Other long term (current) drug therapy: Secondary | ICD-10-CM | POA: Diagnosis not present

## 2018-04-07 DIAGNOSIS — R102 Pelvic and perineal pain: Secondary | ICD-10-CM | POA: Diagnosis not present

## 2018-04-07 DIAGNOSIS — Z0000 Encounter for general adult medical examination without abnormal findings: Secondary | ICD-10-CM | POA: Diagnosis not present

## 2018-04-07 DIAGNOSIS — Z87442 Personal history of urinary calculi: Secondary | ICD-10-CM | POA: Diagnosis not present

## 2018-04-07 LAB — POCT URINALYSIS DIPSTICK
Bilirubin, UA: NEGATIVE
GLUCOSE UA: NEGATIVE
KETONES UA: NEGATIVE
Nitrite, UA: NEGATIVE
PROTEIN UA: POSITIVE — AB
SPEC GRAV UA: 1.02 (ref 1.010–1.025)
Urobilinogen, UA: 0.2 E.U./dL
pH, UA: 6 (ref 5.0–8.0)

## 2018-04-07 MED ORDER — NITROFURANTOIN MONOHYD MACRO 100 MG PO CAPS: 100.0000 mg | ORAL_CAPSULE | Freq: Two times a day (BID) | ORAL | 0 refills | Status: DC

## 2018-04-07 NOTE — Patient Instructions (Signed)

## 2018-04-07 NOTE — Progress Notes (Signed)
Pt states that she is having pressure left side pelvis with sharp shooting pain.   Pt states she has an IUD in place since 2012, would like removed today. Pt was treated for UTI 4 weeks ago.

## 2018-04-07 NOTE — Progress Notes (Signed)
Patient ID: Rebekah Wright, female   DOB: 1976/04/21, 42 y.o.   MRN: [id number]  Rebekah Wright is a 42 y.o. [phone] female here for a routine annual gynecologic exam. She reports that her last pap smear was 5-8 yrs ago. A H/O abnormal pap smear in the past with f/u were normal. H/O IUD past in 2012. Desires to have removed. Was recently treated for a UTI. Still has some LLQ pressure. Sexual active last intercourse was 4 weeks ago.     Gynecologic History No LMP recorded. (Menstrual status: IUD). Contraception: IUD Last Pap: Unknown . Results were: normal Last mammogram: NA. Results were: NA  Obstetric History OB History  Gravida Para Term Preterm AB Living  4 2 2   2 2   SAB TAB Ectopic Multiple Live Births  2       2    # Outcome Date GA Lbr Len/2nd Weight Sex Delivery Anes PTL Lv  4 Term 02/21/10    M CS-LTranv   LIV  3 SAB 07/01/08          2 SAB 07/02/03          1 Term 12/11/99    F CS-LTranv   LIV    Past Medical History:  Diagnosis Date  . Hypertension   . Kidney stone   . Tonsil pain     Past Surgical History:  Procedure Laterality Date  . CYSTECTOMY    . LITHOTRIPSY      Current Outpatient Medications on File Prior to Visit  Medication Sig Dispense Refill  . hydrochlorothiazide (MICROZIDE) 12.5 MG capsule Take 12.5 mg by mouth daily.    Marland Kitchen lisinopril (PRINIVIL,ZESTRIL) 10 MG tablet Take 10 mg by mouth daily.    Marland Kitchen levonorgestrel (MIRENA) 20 MCG/24HR IUD 1 each by Intrauterine route once.     No current facility-administered medications on file prior to visit.     Allergies  Allergen Reactions  . Sudafed [Pseudoephedrine Hcl]     Social History   Socioeconomic History  . Marital status: Single    Spouse name: Not on file  . Number of children: Not on file  . Years of education: Not on file  . Highest education level: Not on file  Occupational History  . Not on file  Social Needs  . Financial resource strain: Not on file  . Food insecurity:   Worry: Not on file    Inability: Not on file  . Transportation needs:    Medical: Not on file    Non-medical: Not on file  Tobacco Use  . Smoking status: Light Tobacco Smoker  . Smokeless tobacco: Never Used  . Tobacco comment:  cigs/day  Substance and Sexual Activity  . Alcohol use: No  . Drug use: No  . Sexual activity: Not on file  Lifestyle  . Physical activity:    Days per week: Not on file    Minutes per session: Not on file  . Stress: Not on file  Relationships  . Social connections:    Talks on phone: Not on file    Gets together: Not on file    Attends religious service: Not on file    Active member of club or organization: Not on file    Attends meetings of clubs or organizations: Not on file    Relationship status: Not on file  . Intimate partner violence:    Fear of current or ex partner: Not on file    Emotionally abused: Not  on file    Physically abused: Not on file    Forced sexual activity: Not on file  Other Topics Concern  . Not on file  Social History Narrative  . Not on file    History reviewed. No pertinent family history.  The following portions of the patient's history were reviewed and updated as appropriate: allergies, current medications, past family history, past medical history, past social history, past surgical history and problem list.  Review of Systems Pertinent items noted in HPI and remainder of comprehensive ROS otherwise negative.   Objective:  BP 119/84   Pulse 86   Ht 5\' 5"  (1.651 m)   Wt 197 lb (89.4 kg)   BMI 32.78 kg/m  CONSTITUTIONAL: Well-developed, well-nourished female in no acute distress.  HENT:  Normocephalic, atraumatic, External right and left ear normal. Oropharynx is clear and moist EYES: Conjunctivae and EOM are normal. Pupils are equal, round, and reactive to light. No scleral icterus.  NECK: Normal range of motion, supple, no masses.  Normal thyroid.  SKIN: Skin is warm and dry. No rash noted. Not  diaphoretic. No erythema. No pallor. NEUROLGIC: Alert and oriented to person, place, and time. Normal reflexes, muscle tone coordination. No cranial nerve deficit noted. PSYCHIATRIC: Normal mood and affect. Normal behavior. Normal judgment and thought content. CARDIOVASCULAR: Normal heart rate noted, regular rhythm RESPIRATORY: Clear to auscultation bilaterally. Effort and breath sounds normal, no problems with respiration noted. BREASTS: Symmetric in size. No masses, skin changes, nipple drainage, or lymphadenopathy. ABDOMEN: Soft, normal bowel sounds, no distention noted.  No tenderness, rebound or guarding.  PELVIC: Normal appearing external genitalia; normal appearing vaginal mucosa and cervix.  No abnormal discharge noted.  Pap smear obtained.  Normal uterine size, no other palpable masses, no uterine or adnexal tenderness. Bladder tenderness noted. IUD strings noted MUSCULOSKELETAL: Normal range of motion. No tenderness.  No cyanosis, clubbing, or edema.  2+ distal pulses.      GYNECOLOGY CLINIC PROCEDURE NOTE  [name] is a 42 y.o. [phone] here for Mirena IUD removal.   IUD Removal  Patient identified, informed consent performed, consent signed.  Patient was in the dorsal lithotomy position, normal external genitalia was noted.  A speculum was placed in the patient's vagina, normal discharge was noted, no lesions. The cervix was visualized, no lesions, no abnormal discharge.  The strings of the IUD were grasped and pulled using ring forceps. The IUD was removed in its entirety.  Patient tolerated the procedure well.    Patient will use condoms for contraception  Nettie Elm MD, FACOG Attending Obstetrician & Gynecologist Center for [facility], [facility]   Assessment:  Annual gynecologic examination with pap smear Pelvic pain IUD removal   Plan:  Will follow up results of pap smear and manage accordingly. Mammogram scheduled Routine  preventative health maintenance measures emphasized. IUD removed without problems Suspect pt's pelvic pressure is related to her bladder. Will treat with long course of Macrobid. Check GYN U/S. F/U in 6 weeks. Please refer to After Visit Summary for other counseling recommendations.    Hermina Staggers, MD, FACOG Attending Obstetrician & Gynecologist Center for [facility], [facility]

## 2018-04-08 LAB — CYTOLOGY - PAP
CHLAMYDIA, DNA PROBE: NEGATIVE
DIAGNOSIS: NEGATIVE
HPV (WINDOPATH): NOT DETECTED
NEISSERIA GONORRHEA: NEGATIVE

## 2018-04-09 LAB — URINE CULTURE: ORGANISM ID, BACTERIA: NO GROWTH

## 2018-04-13 ENCOUNTER — Telehealth: Payer: Self-pay

## 2018-04-13 NOTE — Telephone Encounter (Signed)
Pt called and requested pap results. Results given to pt. Pt requested to have STD screening added to her pap due to still having sx. STD screening added to pap and sent to cytology.

## 2018-04-14 ENCOUNTER — Ambulatory Visit: Payer: No Typology Code available for payment source | Admitting: Obstetrics and Gynecology

## 2018-04-15 ENCOUNTER — Ambulatory Visit (HOSPITAL_COMMUNITY)
Admission: RE | Admit: 2018-04-15 | Discharge: 2018-04-15 | Disposition: A | Discharge status: Home / Self Care | Payer: Medicaid Other | Source: Ambulatory Visit | Attending: Obstetrics and Gynecology | Admitting: Obstetrics and Gynecology

## 2018-04-15 DIAGNOSIS — R102 Pelvic and perineal pain: Secondary | ICD-10-CM | POA: Diagnosis not present

## 2018-05-19 ENCOUNTER — Encounter: Payer: Self-pay | Admitting: Obstetrics and Gynecology

## 2018-05-19 ENCOUNTER — Ambulatory Visit (INDEPENDENT_AMBULATORY_CARE_PROVIDER_SITE_OTHER): Payer: Medicaid Other | Admitting: Obstetrics and Gynecology

## 2018-05-19 VITALS — BP 143/92 | HR 82 | Wt 198.0 lb

## 2018-05-19 DIAGNOSIS — Z3042 Encounter for surveillance of injectable contraceptive: Secondary | ICD-10-CM

## 2018-05-19 DIAGNOSIS — Z3202 Encounter for pregnancy test, result negative: Secondary | ICD-10-CM | POA: Diagnosis not present

## 2018-05-19 DIAGNOSIS — R102 Pelvic and perineal pain: Secondary | ICD-10-CM

## 2018-05-19 LAB — POCT URINE PREGNANCY: Preg Test, Ur: NEGATIVE

## 2018-05-19 MED ORDER — MEDROXYPROGESTERONE ACETATE 150 MG/ML IM SUSP
150.0000 mg | INTRAMUSCULAR | Status: DC
  Administered 2018-05-19 – 2019-02-03 (×3): 150 mg via INTRAMUSCULAR

## 2018-05-19 MED ORDER — MEDROXYPROGESTERONE ACETATE 150 MG/ML IM SUSP: 150.0000 mg | INTRAMUSCULAR | 3 refills | Status: DC

## 2018-05-19 NOTE — Progress Notes (Signed)
Patient ID: Rebekah Wright, female   DOB: 02/13/1976, 42 y.o.   MRN: [id number] Presents for follow up. Doing well. No more pain. U/S results reviewed with pt Pt desires Depo provera for contraception. Last IC 2 months ago  PE AF VSS Lungs clear Heart RRR Abd soft + BS  UPT negative  A/P Pelvic pain, resolved        Depo Provera contraception Depo given today. Rx sent to pharmacy. Continue q 12 weeks for Depo Provera. O/w F/U PRN

## 2018-05-19 NOTE — Progress Notes (Signed)
Patient is in the office for follow up from appt on 04-07-18. Pt reports relief after taking antibiotics for bladder infection. Pt had u/s on 04-15-18 Administered depo, L Del, and pt tolerated well .Marland Kitchen. Administrations This Visit    medroxyPROGESTERone (DEPO-PROVERA) injection 150 mg    Admin Date 05/19/2018 Action Given Dose 150 mg Route Intramuscular Administered By Katrina StackStalling, Kalel Harty D, RN

## 2018-05-19 NOTE — Patient Instructions (Signed)

## 2018-06-17 ENCOUNTER — Ambulatory Visit
Admission: RE | Admit: 2018-06-17 | Discharge: 2018-06-17 | Disposition: A | Discharge status: Home / Self Care | Payer: Medicaid Other | Source: Ambulatory Visit | Attending: Obstetrics and Gynecology | Admitting: Obstetrics and Gynecology

## 2018-06-17 DIAGNOSIS — Z01419 Encounter for gynecological examination (general) (routine) without abnormal findings: Secondary | ICD-10-CM

## 2018-06-18 ENCOUNTER — Other Ambulatory Visit: Payer: Self-pay | Admitting: Obstetrics and Gynecology

## 2018-06-18 DIAGNOSIS — R928 Other abnormal and inconclusive findings on diagnostic imaging of breast: Secondary | ICD-10-CM

## 2018-06-25 ENCOUNTER — Ambulatory Visit
Admission: RE | Admit: 2018-06-25 | Discharge: 2018-06-25 | Disposition: A | Discharge status: Home / Self Care | Payer: Medicaid Other | Source: Ambulatory Visit | Attending: Obstetrics and Gynecology | Admitting: Obstetrics and Gynecology

## 2018-06-25 ENCOUNTER — Other Ambulatory Visit: Payer: Self-pay | Admitting: Obstetrics and Gynecology

## 2018-06-25 DIAGNOSIS — R928 Other abnormal and inconclusive findings on diagnostic imaging of breast: Secondary | ICD-10-CM

## 2018-06-25 DIAGNOSIS — N6320 Unspecified lump in the left breast, unspecified quadrant: Secondary | ICD-10-CM

## 2018-08-11 ENCOUNTER — Ambulatory Visit (INDEPENDENT_AMBULATORY_CARE_PROVIDER_SITE_OTHER): Payer: Medicaid Other

## 2018-08-11 ENCOUNTER — Ambulatory Visit: Payer: Medicaid Other

## 2018-08-11 DIAGNOSIS — Z3042 Encounter for surveillance of injectable contraceptive: Secondary | ICD-10-CM

## 2018-08-11 NOTE — Progress Notes (Signed)
Patient is in the office for depo, administered in R Del and pt tolerated well, next depo due 4/29-5/13. .. Administrations This Visit    medroxyPROGESTERone (DEPO-PROVERA) injection 150 mg    Admin Date 08/11/2018 Action Given Dose 150 mg Route Intramuscular Administered By Katrina Stack, RN

## 2018-08-11 NOTE — Progress Notes (Signed)
Patient ID: Rebekah Wright, female   DOB: January 04, 1976, 43 y.o.   MRN: [id number] I have reviewed the chart and agree with nursing staff's documentation of this patient's encounter.  Scheryl Darter, MD 08/11/2018 4:26 PM

## 2018-11-04 ENCOUNTER — Other Ambulatory Visit: Payer: Self-pay

## 2018-11-04 ENCOUNTER — Ambulatory Visit: Payer: Medicaid Other

## 2018-11-04 ENCOUNTER — Ambulatory Visit (INDEPENDENT_AMBULATORY_CARE_PROVIDER_SITE_OTHER): Payer: Medicaid Other

## 2018-11-04 DIAGNOSIS — Z3042 Encounter for surveillance of injectable contraceptive: Secondary | ICD-10-CM | POA: Diagnosis not present

## 2018-11-04 MED ORDER — MEDROXYPROGESTERONE ACETATE 150 MG/ML IM SUSP
150.0000 mg | Freq: Once | INTRAMUSCULAR | Status: AC
  Administered 2018-11-04: 150 mg via INTRAMUSCULAR

## 2018-11-04 NOTE — Progress Notes (Signed)
I have reviewed the chart and agree with nursing staff's documentation of this patient's encounter.  Catalina Antigua, MD 11/04/2018 1:42 PM

## 2018-11-04 NOTE — Progress Notes (Signed)
Pt presents for depo inj. Injection given in left deltoid. Pt tolerated well. Next depo due 7/22-8/5.

## 2018-12-28 ENCOUNTER — Other Ambulatory Visit: Payer: Self-pay | Admitting: Obstetrics and Gynecology

## 2018-12-28 ENCOUNTER — Ambulatory Visit
Admission: RE | Admit: 2018-12-28 | Discharge: 2018-12-28 | Disposition: A | Discharge status: Home / Self Care | Payer: Medicaid Other | Source: Ambulatory Visit | Attending: Obstetrics and Gynecology | Admitting: Obstetrics and Gynecology

## 2018-12-28 ENCOUNTER — Other Ambulatory Visit: Payer: Self-pay

## 2018-12-28 DIAGNOSIS — N6320 Unspecified lump in the left breast, unspecified quadrant: Secondary | ICD-10-CM

## 2019-02-01 ENCOUNTER — Ambulatory Visit: Payer: Medicaid Other

## 2019-02-03 ENCOUNTER — Ambulatory Visit (INDEPENDENT_AMBULATORY_CARE_PROVIDER_SITE_OTHER): Payer: Medicaid Other

## 2019-02-03 ENCOUNTER — Other Ambulatory Visit: Payer: Self-pay

## 2019-02-03 DIAGNOSIS — Z3042 Encounter for surveillance of injectable contraceptive: Secondary | ICD-10-CM

## 2019-02-03 NOTE — Progress Notes (Signed)
Nurse visit for pt supply Depo Pt is within her window Depo given LD w/o difficulty Next Depo due Oct 21 - Nov 4, pt agrees

## 2019-02-08 NOTE — Progress Notes (Signed)
Patient ID: Rebekah Wright, female   DOB: 10-20-1975, 43 y.o.   MRN: [id number] Patient seen and assessed by nursing staff during this encounter. I have reviewed the chart and agree with the documentation and plan.  Emeterio Reeve, MD 02/08/2019 3:22 PM

## 2019-04-29 ENCOUNTER — Ambulatory Visit: Payer: Medicaid Other

## 2019-05-31 ENCOUNTER — Other Ambulatory Visit: Payer: Self-pay | Admitting: Obstetrics and Gynecology

## 2019-06-17 ENCOUNTER — Other Ambulatory Visit (HOSPITAL_COMMUNITY)
Admission: RE | Admit: 2019-06-17 | Discharge: 2019-06-17 | Disposition: A | Discharge status: Home / Self Care | Payer: Medicaid Other | Source: Ambulatory Visit | Attending: Women's Health | Admitting: Women's Health

## 2019-06-17 ENCOUNTER — Ambulatory Visit: Payer: Medicaid Other | Admitting: Women's Health

## 2019-06-17 ENCOUNTER — Encounter: Payer: Self-pay | Admitting: Women's Health

## 2019-06-17 ENCOUNTER — Other Ambulatory Visit: Payer: Self-pay

## 2019-06-17 VITALS — BP 157/108 | HR 87 | Wt 208.0 lb

## 2019-06-17 DIAGNOSIS — Z113 Encounter for screening for infections with a predominantly sexual mode of transmission: Secondary | ICD-10-CM

## 2019-06-17 DIAGNOSIS — B3731 Acute candidiasis of vulva and vagina: Secondary | ICD-10-CM

## 2019-06-17 DIAGNOSIS — Z30430 Encounter for insertion of intrauterine contraceptive device: Secondary | ICD-10-CM | POA: Insufficient documentation

## 2019-06-17 DIAGNOSIS — B373 Candidiasis of vulva and vagina: Secondary | ICD-10-CM

## 2019-06-17 DIAGNOSIS — Z3202 Encounter for pregnancy test, result negative: Secondary | ICD-10-CM

## 2019-06-17 LAB — POCT URINE PREGNANCY: Preg Test, Ur: NEGATIVE

## 2019-06-17 MED ORDER — LEVONORGESTREL 20 MCG/24HR IU IUD: INTRAUTERINE_SYSTEM | Freq: Once | INTRAUTERINE | Status: AC

## 2019-06-17 MED ORDER — FLUCONAZOLE 150 MG PO TABS: 150.0000 mg | ORAL_TABLET | Freq: Once | ORAL | 0 refills | Status: AC

## 2019-06-17 NOTE — Progress Notes (Signed)
History:  [name] is a 43 y.o. [id number] who presents to clinic today for insertion of Mirena IUD with no c/o. She states that she has had the Mirena IUD 2 other times and tolerated it well with no issues except for irregular bleeding. She reports that she previously used Depo, with her last one being 4 months ago. She was due again for Depo 05/31/2019, but decided not to continue with this method. States that with Depo, she bled for 6 weeks continuously in the month preceding her next shot. Says that she has difficulty remembering to take OCPs. Pt feels that she has explored other contraceptive options and would like to proceed with IUD. Would like contraception today for pregnancy prevention (has 2 kids, does not desire any more). Pt voiced interest in obtaining full STI screening today.  Hypertension: Patient states that she has a 19-year history of HTN. Says that she responds well to Lisinopril-HCTZ, but forgets to take her medications. Says that 3 years ago her BP was controlled without medications following significant intentional weight loss. 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  LMP: December 1st +/- 1 day Last sexual activity was reported to be 3-4 weeks ago, prior to her last menses.  Informed consent given to include what Mirena is, how it works, effectiveness, return to fertility, benefits, side effects, risks, other choices of contraception, IUD placement process, removal, post-insertion expectations/care, RTO 4-6wks to check IUD. Informed consent signed. UPT negative in office.  Pt has HTN only and has no allergies. Pt has no contraindications to IUD use. Questions answered to pt satisfaction. Last Pap done 04/08/2019, NILM/HPV neg. GC/CT done today along with full panel STD testing as requested by patient.  Bimanual exam performed - uterus normal size and position, nontender. Speculum inserted, cervix fully visualized.  Betadine applied to cervix x3. Tenaculum applied at 10 & 2 o'clock. Uterus sounded to 8cm. Mirena loaded w/ sterile gloves, measured to 8cm. IUD inserted w/out difficulty. Strings trimmed to 2-3cm. Tenaculum removed, bleeding stopped quickly w/ pressure. Pt tolerated procedure well, minor cramping that improved before leaving office. Pt reports she took ibuprofen prior to today's appointment.  The following portions of the patient's history were reviewed and updated as appropriate: allergies, current medications, family history, past medical history, social history, past surgical history and problem list.  Review of Systems:  Review of Systems  Constitutional: Negative for chills, fever, malaise/fatigue and weight loss.  Eyes: Negative for blurred vision, double vision and pain.  Respiratory: Negative for shortness of breath.   Cardiovascular: Negative for chest pain and palpitations.  Gastrointestinal: Negative for abdominal pain, nausea and vomiting.  Genitourinary: Negative for dysuria, frequency and urgency.  Neurological: Negative for dizziness and headaches.     Objective:  Physical Exam BP (!) 157/108   Pulse 87   Wt 208 lb (94.3 kg)   LMP 06/01/2019   BMI 34.61 kg/m  Physical Exam  Constitutional: She is oriented to person, place, and time. She appears well-developed and well-nourished. No distress.  HENT:  Head: Normocephalic and atraumatic.  Cardiovascular: Normal rate, regular rhythm and normal heart sounds. Exam reveals no gallop and no friction rub.  No murmur heard. Respiratory: Effort normal and breath sounds normal. No respiratory distress. She has no wheezes.  GI: Soft.  Genitourinary:    Vagina normal.  There is no rash, tenderness or lesion on the right labia. There is no rash, tenderness  or lesion on the left labia. Uterus is not enlarged and not tender. Cervix exhibits no motion tenderness, no discharge and no friability. Right adnexum displays no mass, no  tenderness and no fullness. Left adnexum displays no mass, no tenderness and no fullness.    No vaginal discharge, tenderness or bleeding.  No tenderness or bleeding in the vagina.  Neurological: She is alert and oriented to person, place, and time.  Skin: Skin is warm and dry. She is not diaphoretic.  Psychiatric: She has a normal mood and affect. Her behavior is normal. Judgment and thought content normal.   Labs and Imaging Results for orders placed or performed in visit on 06/17/19 (from the past 24 hour(s))  POCT urine pregnancy     Status: None   Collection Time: 06/17/19 10:30 AM  Result Value Ref Range   Preg Test, Ur Negative Negative    No results found.   Assessment & Plan:  1. Encounter for IUD insertion Patient desired IUD for contraception. Consent was obtained, and the benefits and risks of the procedure were discussed with the patient. IUD was inserted without complications. - POCT urine pregnancy - Cervicovaginal ancillary only( [place]): GC/chlamydia, trichomonas - Mirena IUD inserted (Lot#: [place], [place])  2. Screening examination for sexually transmitted disease Patient expressed interest in obtaining STI screening. No specific concerns for STI exposure. - HIV, RPR, hep B and C - vaginal swab performed  3. Hypertension - Discussed methods that can help to remember to take BP medications, including setting alarms, taking meds at same time every day, and used motivational interviewing techniques. -pt denies HA or other symptoms related to BP today - Advised patient to f/u with PCP for HTN  4. Vaginal yeast infection - white clumped discharge visualized in vagina, patient given the opportunity to treat yeast infection and then proceed with IUD insertion, pt declines and desires IUD insertion today, will send RX for Diflucan. - fluconazole (DIFLUCAN) 150 MG tablet; Take 1 tablet (150 mg total) by mouth once for 1 dose.  Dispense: 1 tablet; Refill:  0  Dasie Chancellor, Gerrie Nordmann, NP 06/17/2019 11:46 AM

## 2019-06-17 NOTE — Progress Notes (Signed)
Pt presents for Mirena insertion Depo caused prolonged bleeding  Last IC 3 wks ago Elevated BP; took meds aprx 9 am; symptomatic with HA's.

## 2019-06-17 NOTE — Patient Instructions (Addendum)
You have had the Mirena IUD inserted today. It is good for 5 years and needs to be removed/replaced by 06/16/2024. Please abstain from intercourse or use a back-up method for 1 week after insertion. Abdominal/pelvic cramping is normal and should resolve after about 24-48 hours. You can take Ibuprofen every 8 hours if needed for the pain and not contraindicated based on your personal medical history. Take this medication WITH FOOD to avoid upset stomach. You may have light spotting or a period after insertion, this is normal.  If you have severe abdominal pain, heavy vaginal bleeding (changing pad/tampon every hour or more often), or fever above 101 degrees, please call the office or go to the Emergency Room for evaluation.  Perform monthly string checks. If the strings are bothering you or your partner, you can return to the clinic to have them cut shorter. If the strings seem longer or shorter than normal, or you cannot feel them, please call our office to schedule an appointment.   Levonorgestrel intrauterine device (IUD) What is this medicine? LEVONORGESTREL IUD (LEE voe nor jes trel) is a contraceptive (birth control) device. The device is placed inside the uterus by a healthcare professional. It is used to prevent pregnancy. This device can also be used to treat heavy bleeding that occurs during your period. This medicine may be used for other purposes; ask your health care provider or pharmacist if you have questions. COMMON BRAND NAME(S): Cameron Ali What should I tell my health care provider before I take this medicine? They need to know if you have any of these conditions:  abnormal Pap smear  cancer of the breast, uterus, or cervix  diabetes  endometritis  genital or pelvic infection now or in the past  have more than one sexual partner or your partner has more than one partner  heart disease  history of an ectopic or tubal pregnancy  immune system  problems  IUD in place  liver disease or tumor  problems with blood clots or take blood-thinners  seizures  use intravenous drugs  uterus of unusual shape  vaginal bleeding that has not been explained  an unusual or allergic reaction to levonorgestrel, other hormones, silicone, or polyethylene, medicines, foods, dyes, or preservatives  pregnant or trying to get pregnant  breast-feeding How should I use this medicine? This device is placed inside the uterus by a health care professional. Talk to your pediatrician regarding the use of this medicine in children. Special care may be needed. Overdosage: If you think you have taken too much of this medicine contact a poison control center or emergency room at once. NOTE: This medicine is only for you. Do not share this medicine with others. What if I miss a dose? This does not apply. Depending on the brand of device you have inserted, the device will need to be replaced every 3 to 6 years if you wish to continue using this type of birth control. What may interact with this medicine? Do not take this medicine with any of the following medications:  amprenavir  bosentan  fosamprenavir This medicine may also interact with the following medications:  aprepitant  armodafinil  barbiturate medicines for inducing sleep or treating seizures  bexarotene  boceprevir  griseofulvin  medicines to treat seizures like carbamazepine, ethotoin, felbamate, oxcarbazepine, phenytoin, topiramate  modafinil  pioglitazone  rifabutin  rifampin  rifapentine  some medicines to treat HIV infection like atazanavir, efavirenz, indinavir, lopinavir, nelfinavir, tipranavir, ritonavir  St. John's wort  warfarin This list may not describe all possible interactions. Give your health care provider a list of all the medicines, herbs, non-prescription drugs, or dietary supplements you use. Also tell them if you smoke, drink alcohol, or use  illegal drugs. Some items may interact with your medicine. What should I watch for while using this medicine? Visit your doctor or health care professional for regular check ups. See your doctor if you or your partner has sexual contact with others, becomes HIV positive, or gets a sexual transmitted disease. This product does not protect you against HIV infection (AIDS) or other sexually transmitted diseases. You can check the placement of the IUD yourself by reaching up to the top of your vagina with clean fingers to feel the threads. Do not pull on the threads. It is a good habit to check placement after each menstrual period. Call your doctor right away if you feel more of the IUD than just the threads or if you cannot feel the threads at all. The IUD may come out by itself. You may become pregnant if the device comes out. If you notice that the IUD has come out use a backup birth control method like condoms and call your health care provider. Using tampons will not change the position of the IUD and are okay to use during your period. This IUD can be safely scanned with magnetic resonance imaging (MRI) only under specific conditions. Before you have an MRI, tell your healthcare provider that you have an IUD in place, and which type of IUD you have in place. What side effects may I notice from receiving this medicine? Side effects that you should report to your doctor or health care professional as soon as possible:  allergic reactions like skin rash, itching or hives, swelling of the face, lips, or tongue  fever, flu-like symptoms  genital sores  high blood pressure  no menstrual period for 6 weeks during use  pain, swelling, warmth in the leg  pelvic pain or tenderness  severe or sudden headache  signs of pregnancy  stomach cramping  sudden shortness of breath  trouble with balance, talking, or walking  unusual vaginal bleeding, discharge  yellowing of the eyes or skin Side  effects that usually do not require medical attention (report to your doctor or health care professional if they continue or are bothersome):  acne  breast pain  change in sex drive or performance  changes in weight  cramping, dizziness, or faintness while the device is being inserted  headache  irregular menstrual bleeding within first 3 to 6 months of use  nausea This list may not describe all possible side effects. Call your doctor for medical advice about side effects. You may report side effects to FDA at 1-800-FDA-1088. Where should I keep my medicine? This does not apply. NOTE: This sheet is a summary. It may not cover all possible information. If you have questions about this medicine, talk to your doctor, pharmacist, or health care provider.  2020 Elsevier/Gold Standard (2018-04-28 13:22:01)

## 2019-06-18 LAB — CERVICOVAGINAL ANCILLARY ONLY
Bacterial Vaginitis (gardnerella): NEGATIVE
Candida Glabrata: NEGATIVE
Candida Vaginitis: POSITIVE — AB
Chlamydia: NEGATIVE
Comment: NEGATIVE
Comment: NEGATIVE
Comment: NEGATIVE
Comment: NEGATIVE
Comment: NEGATIVE
Comment: NORMAL
Neisseria Gonorrhea: NEGATIVE
Trichomonas: NEGATIVE

## 2019-06-18 LAB — HEPATITIS B SURFACE ANTIGEN: Hepatitis B Surface Ag: NEGATIVE

## 2019-06-18 LAB — HIV ANTIBODY (ROUTINE TESTING W REFLEX): HIV Screen 4th Generation wRfx: NONREACTIVE

## 2019-06-18 LAB — HEPATITIS C ANTIBODY: Hep C Virus Ab: 0.1 s/co ratio (ref 0.0–0.9)

## 2019-06-18 LAB — RPR: RPR Ser Ql: NONREACTIVE

## 2019-06-18 NOTE — Progress Notes (Signed)
Good morning Rebekah Wright,  Your HIV, syphilis, Hepatitis B and Hepatitis C testing came back negative.  If you have any questions, please call our office.  Thank you, Elmyra Ricks

## 2019-06-30 ENCOUNTER — Other Ambulatory Visit: Payer: Self-pay

## 2019-06-30 ENCOUNTER — Ambulatory Visit
Admission: RE | Admit: 2019-06-30 | Discharge: 2019-06-30 | Disposition: A | Discharge status: Home / Self Care | Payer: Medicaid Other | Source: Ambulatory Visit | Attending: Obstetrics and Gynecology | Admitting: Obstetrics and Gynecology

## 2019-06-30 ENCOUNTER — Other Ambulatory Visit: Payer: Self-pay | Admitting: Obstetrics and Gynecology

## 2019-06-30 DIAGNOSIS — N6320 Unspecified lump in the left breast, unspecified quadrant: Secondary | ICD-10-CM

## 2019-06-30 MED ORDER — PHENAZOPYRIDINE HCL 200 MG PO TABS: 200.0000 mg | ORAL_TABLET | Freq: Three times a day (TID) | ORAL | 0 refills | Status: DC | PRN

## 2019-06-30 MED ORDER — NITROFURANTOIN MONOHYD MACRO 100 MG PO CAPS: 100.0000 mg | ORAL_CAPSULE | Freq: Two times a day (BID) | ORAL | 0 refills | Status: DC

## 2019-06-30 NOTE — Progress Notes (Signed)
Pt c/o dysuria, urinary urgency, and frequency for the past 36 hours. She reports taking some expired AZO and cranberry juice, neither gave relief.  Macrobid and Pyridium where sent to the pharmacy. Pt advise to come into the office if treatment does not work.  Pt verbalized understanding. -EH/RMA

## 2019-07-05 ENCOUNTER — Ambulatory Visit (HOSPITAL_BASED_OUTPATIENT_CLINIC_OR_DEPARTMENT_OTHER)
Admission: RE | Admit: 2019-07-05 | Discharge: 2019-07-05 | Disposition: A | Discharge status: Home / Self Care | Payer: Medicaid Other | Source: Ambulatory Visit | Attending: Women's Health | Admitting: Women's Health

## 2019-07-05 ENCOUNTER — Other Ambulatory Visit: Payer: Self-pay

## 2019-07-05 ENCOUNTER — Telehealth: Payer: Self-pay | Admitting: Women's Health

## 2019-07-05 DIAGNOSIS — R102 Pelvic and perineal pain: Secondary | ICD-10-CM | POA: Diagnosis present

## 2019-07-05 NOTE — Telephone Encounter (Signed)
In reviewing schedule fro Friday 07/08/2018, saw this patient was scheduled for IUD string check and pelvic pain. Called and discussed sx with patient. Pt reports she was recently prescribed ABX for a suspected urinary tract infection which helped with the symptoms, but she occasionally experiences a pelvic pressure and intermittent stabbing pain. Pt denies constant pain, abnormal discharge, fever. Pt accepts offer of [place] to assess IUD placement. Order entered and message sent to clinic with high importance to schedule pt for [place] for tomorrow or Wednesday.  Jaidev Sanger, Odie Sera, NP  9:18 AM 07/05/2019

## 2019-07-09 ENCOUNTER — Ambulatory Visit: Payer: Medicaid Other | Admitting: Women's Health

## 2019-07-14 ENCOUNTER — Telehealth: Payer: Self-pay | Admitting: Women's Health

## 2019-07-14 DIAGNOSIS — R102 Pelvic and perineal pain: Secondary | ICD-10-CM

## 2019-07-14 NOTE — Telephone Encounter (Signed)
Pt called and identified by two identifiers. Pt reports pelvic pain has completely resolved. Pt informed of [place] results and informed that provider spoke with radiology who recommended 3D [place] to confirm position of IUD arms, as they were not adequately visualized on 2D [place]. 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 Pt encouraged to call office when she is ready to schedule [place]. Order entered and note made that patient will call to schedule when she is ready. Pt advised that even if IUD is malpositioned, if she is not having pain, IUD should function as intended, but pt reminded that IUD is not 100% effective in preventing pregnancy even when perfectly positioned. Pt verbalizes understanding and questions answered to patient satisfaction.  Marylen Ponto, NP  4:34 PM 07/14/2019

## 2020-11-23 ENCOUNTER — Ambulatory Visit
Admission: EM | Admit: 2020-11-23 | Discharge: 2020-11-23 | Disposition: A | Discharge status: Home / Self Care | Payer: Medicaid Other | Attending: Family Medicine | Admitting: Family Medicine

## 2020-11-23 ENCOUNTER — Other Ambulatory Visit: Payer: Self-pay

## 2020-11-23 DIAGNOSIS — R030 Elevated blood-pressure reading, without diagnosis of hypertension: Secondary | ICD-10-CM

## 2020-11-23 DIAGNOSIS — S39012A Strain of muscle, fascia and tendon of lower back, initial encounter: Secondary | ICD-10-CM

## 2020-11-23 MED ORDER — CYCLOBENZAPRINE HCL 10 MG PO TABS: 10.0000 mg | ORAL_TABLET | Freq: Three times a day (TID) | ORAL | 0 refills | Status: DC | PRN

## 2020-11-23 MED ORDER — PREDNISONE 20 MG PO TABS: 40.0000 mg | ORAL_TABLET | Freq: Every day | ORAL | 0 refills | Status: DC

## 2020-11-23 NOTE — ED Triage Notes (Signed)
Pt states picking up an empty fish tank yesterday and felt a pop to lower back. Pt c/o back pain on movement. States COVID + x5days.

## 2020-11-23 NOTE — ED Triage Notes (Signed)
provider requested vitals recheck

## 2020-11-23 NOTE — ED Provider Notes (Addendum)
[facility]    CSN: [id number] Arrival date & time: 11/23/20  0959      History   Chief Complaint Chief Complaint  Patient presents with  . Covid Positive  . Back Pain    HPI Rebekah Wright is a 45 y.o. female.   Patient presenting today with 1 day history of sudden onset left low back pain after she was lifting her fish tank at home.  She states she immediately felt a pop sensation in the back and had severe pain.  She states she could not walk for several hours after incident but that has improved now able to ambulate well with significant pain.  Denies radiation of pain down either leg, numbness, tingling, weakness, bowel or bladder incontinence, fever, chills, abdominal pain.  Taking Tylenol and ibuprofen with mild relief.  No past history of back injuries.     Past Medical History:  Diagnosis Date  . Hypertension   . Kidney stone   . Tonsil pain     Patient Active Problem List   Diagnosis Date Noted  . Encounter for Depo-Provera contraception 05/19/2018  . Visit for routine gyn exam 04/07/2018  . Pelvic pain 04/07/2018    Past Surgical History:  Procedure Laterality Date  . CYSTECTOMY    . LITHOTRIPSY      OB History    Gravida  4   Para  2   Term  2   Preterm      AB  2   Living  2     SAB  2   IAB      Ectopic      Multiple      Live Births  2            Home Medications    Prior to Admission medications   Medication Sig Start Date End Date Taking? Authorizing Provider  cyclobenzaprine (FLEXERIL) 10 MG tablet Take 1 tablet (10 mg total) by mouth 3 (three) times daily as needed for muscle spasms. Do not drink alcohol or drive while taking this medication.  May cause drowsiness. 11/23/20  Yes Particia Nearing, PA-C  predniSONE (DELTASONE) 20 MG tablet Take 2 tablets (40 mg total) by mouth daily with breakfast. 11/23/20  Yes Particia Nearing, PA-C  hydrochlorothiazide (MICROZIDE) 12.5 MG capsule Take 12.5 mg  by mouth daily.    [provider]  lisinopril (PRINIVIL,ZESTRIL) 10 MG tablet Take 10 mg by mouth daily.    [provider]    Family History Family History  Problem Relation Age of Onset  . Colon cancer Mother   . Parkinson's disease Father     Social History Social History   Tobacco Use  . Smoking status: Light Tobacco Smoker  . Smokeless tobacco: Never Used  . Tobacco comment:  cigs/day  Substance Use Topics  . Alcohol use: No  . Drug use: No     Allergies   Sudafed [pseudoephedrine hcl]   Review of Systems Review of Systems Per HPI  Physical Exam Triage Vital Signs ED Triage Vitals  Enc Vitals Group     BP 11/23/20 1040 (!) 202/131     Pulse Rate 11/23/20 1040 (!) 117     Resp 11/23/20 1040 18     Temp 11/23/20 1040 98.1 F (36.7 C)     Temp Source 11/23/20 1040 Oral     SpO2 11/23/20 1040 96 %     Weight --      Height --  Head Circumference --      Peak Flow --      Pain Score 11/23/20 1041 8     Pain Loc --      Pain Edu? --      Excl. in GC? --    No data found.  Updated Vital Signs BP (!) 175/125 (BP Location: Left Arm)   Pulse 100   Temp 98.1 F (36.7 C) (Oral)   Resp 18   SpO2 97%   Visual Acuity Right Eye Distance:   Left Eye Distance:   Bilateral Distance:    Right Eye Near:   Left Eye Near:    Bilateral Near:     Physical Exam Vitals and nursing note reviewed.  Constitutional:      Appearance: Normal appearance. She is not ill-appearing.  HENT:     Head: Atraumatic.     Mouth/Throat:     Mouth: Mucous membranes are moist.     Pharynx: Oropharynx is clear.  Eyes:     Extraocular Movements: Extraocular movements intact.     Conjunctiva/sclera: Conjunctivae normal.  Cardiovascular:     Rate and Rhythm: Normal rate and regular rhythm.     Heart sounds: Normal heart sounds.  Pulmonary:     Effort: Pulmonary effort is normal. No respiratory distress.     Breath sounds: Normal breath sounds. No  wheezing or rales.  Abdominal:     General: Bowel sounds are normal.     Palpations: Abdomen is soft.  Musculoskeletal:        General: Signs of injury present. No swelling or deformity. Normal range of motion.     Cervical back: Normal range of motion and neck supple.     Comments: No midline spinal tenderness to palpation diffusely Negative straight leg raise bilaterally Normal gait though antalgic Tenderness palpation left lumbar musculature with deep palpation  Skin:    General: Skin is warm and dry.  Neurological:     Mental Status: She is alert and oriented to person, place, and time.     Comments: Bilateral lower extremities neurovascularly intact  Psychiatric:        Mood and Affect: Mood normal.        Thought Content: Thought content normal.        Judgment: Judgment normal.    UC Treatments / Results  Labs (all labs ordered are listed, but only abnormal results are displayed) Labs Reviewed - No data to display  EKG   Radiology No results found.  Procedures Procedures (including critical care time)  Medications Ordered in UC Medications - No data to display  Initial Impression / Assessment and Plan / UC Course  I have reviewed the triage vital signs and the nursing notes.  Pertinent labs & imaging results that were available during my care of the patient were reviewed by me and considered in my medical decision making (see chart for details).     No evidence of bony injury today, imaging deferred with shared decision making today.  Suspect strain of left lumbar musculature causing symptoms, will treat with prednisone, Flexeril, warm Epsom salt baths, rest, massage.  Work note given for rest.  Return for acutely worsening symptoms.  Blood pressure also noted to be significantly elevated in clinic today, likely secondary to pain and anxiety over condition.  She denies any chest pain, shortness of breath, dizziness, visual changes and will continue to monitor at  home after rest and follow-up with PCP if not improving.  Final Clinical Impressions(s) / UC Diagnoses   Final diagnoses:  Strain of lumbar region, initial encounter  Elevated blood pressure reading   Discharge Instructions   None    ED Prescriptions    Medication Sig Dispense Auth. Provider   predniSONE (DELTASONE) 20 MG tablet Take 2 tablets (40 mg total) by mouth daily with breakfast. 10 tablet Particia Nearing, PA-C   cyclobenzaprine (FLEXERIL) 10 MG tablet Take 1 tablet (10 mg total) by mouth 3 (three) times daily as needed for muscle spasms. Do not drink alcohol or drive while taking this medication.  May cause drowsiness. 15 tablet Particia Nearing, [place]     PDMP not reviewed this encounter.   Particia Nearing, [place] 11/23/20 1118    [address] [place], [place] 11/23/20 1120

## 2020-12-12 IMAGING — US US BREAST*L* LIMITED INC AXILLA
1 series · 6 of 6 positions shown · non-contrast
Comparison: Previous exam(s).

CLINICAL DATA: Patient presents for 1 year follow-up of a probably
benign left breast mass, and is also due for annual exam of the
right breast.

EXAM:
DIGITAL DIAGNOSTIC BILATERAL MAMMOGRAM WITH CAD AND TOMO
ULTRASOUND BILATERAL BREAST

[Series 1: us breast*left* limited inc axilla · 0.07mm/px · 6 of 6 slices shown]
[im 1/6]
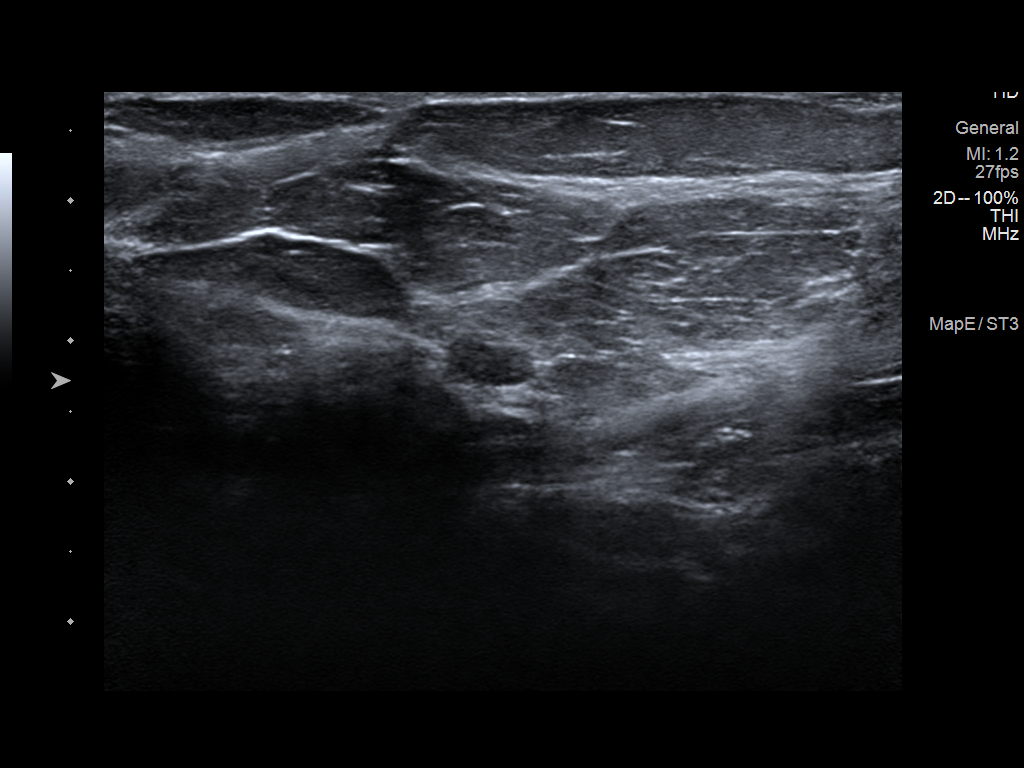
[im 2/6]
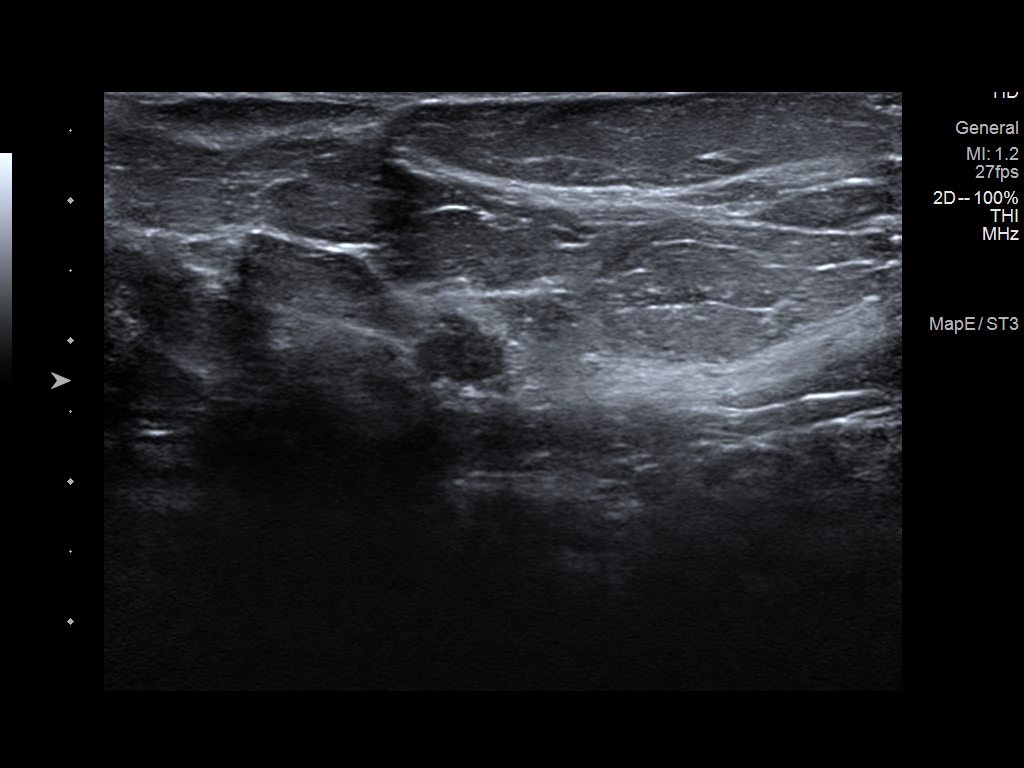
[im 3/6]
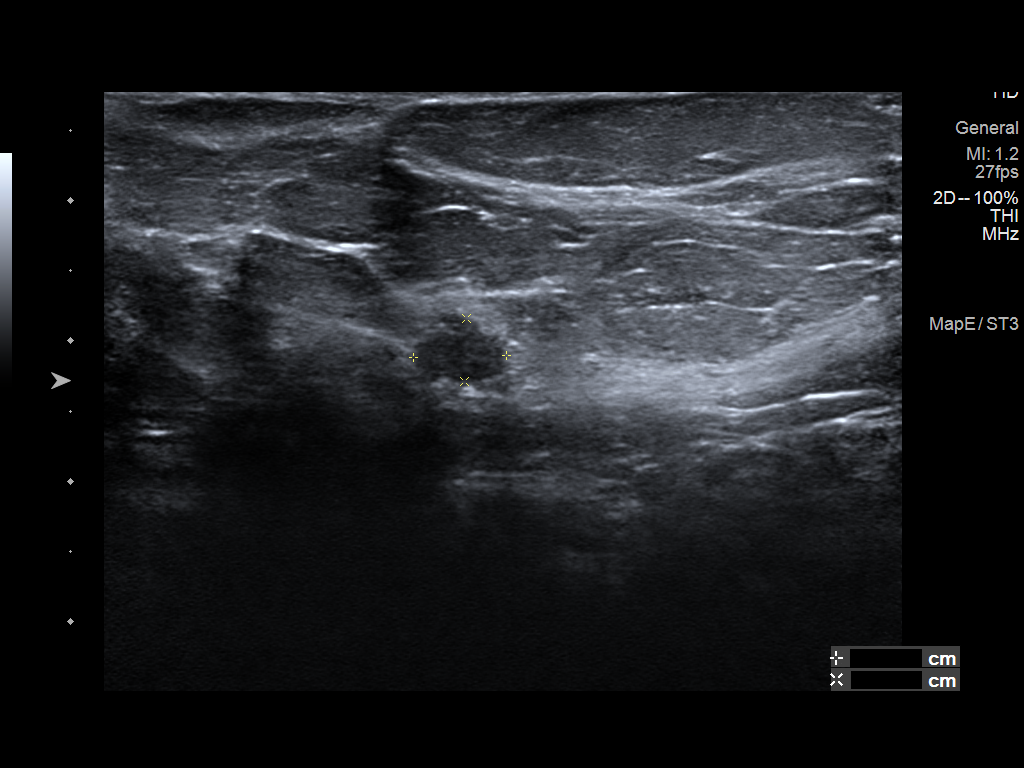
[im 4/6]
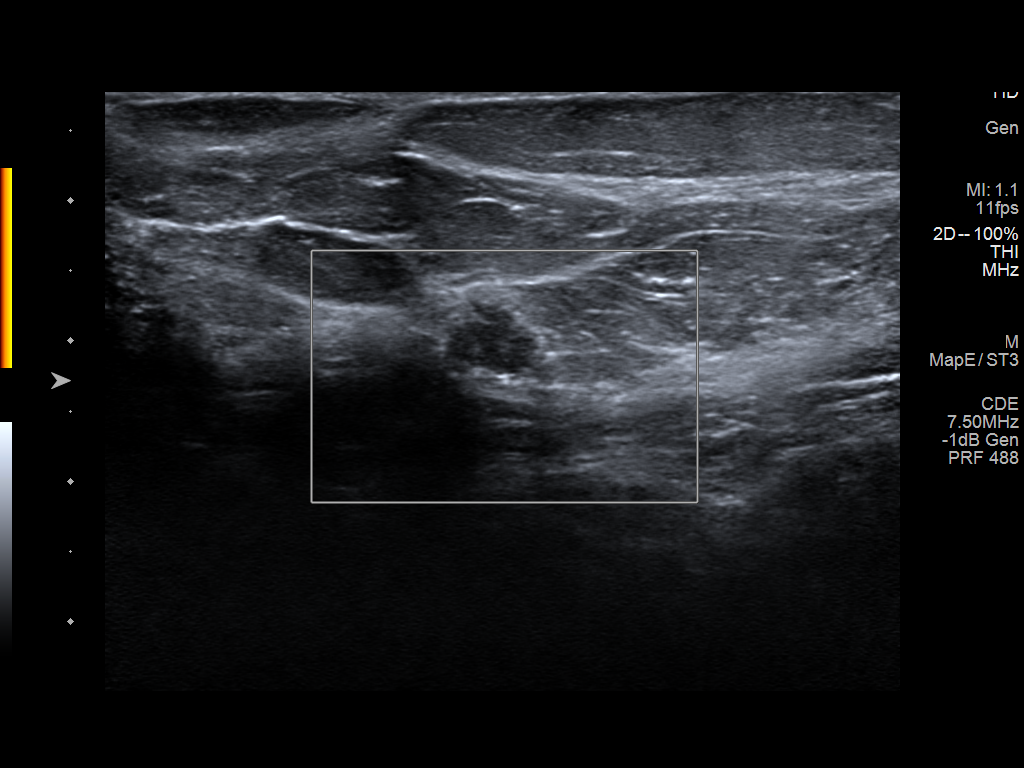
[im 5/6]
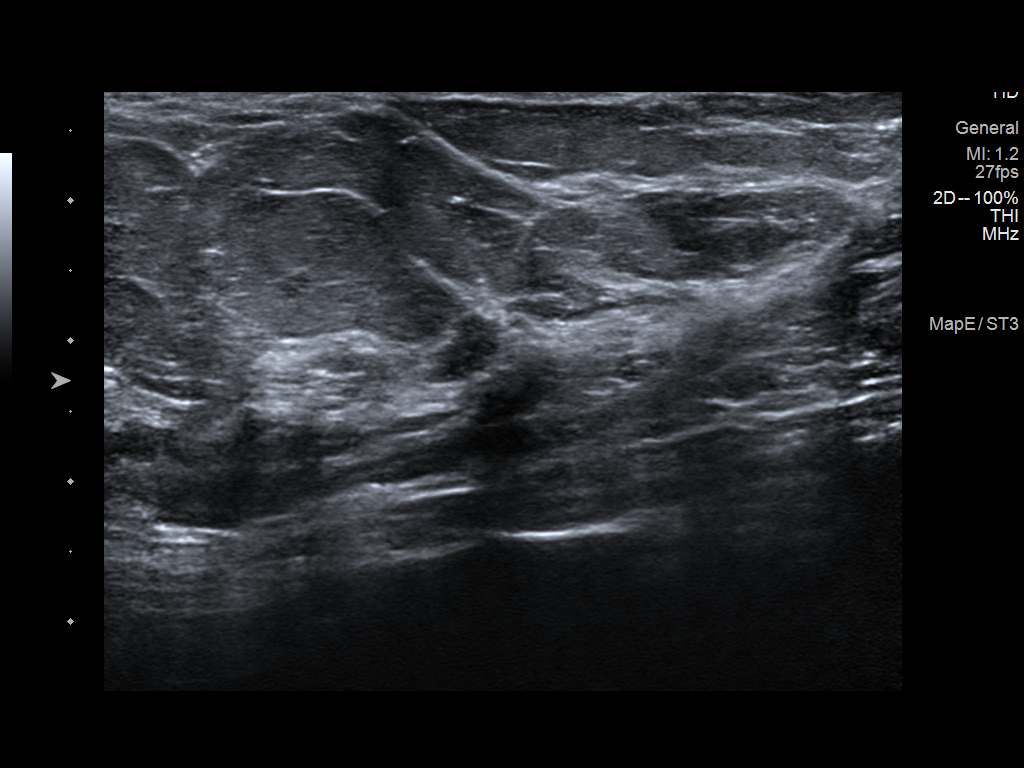
[im 6/6]
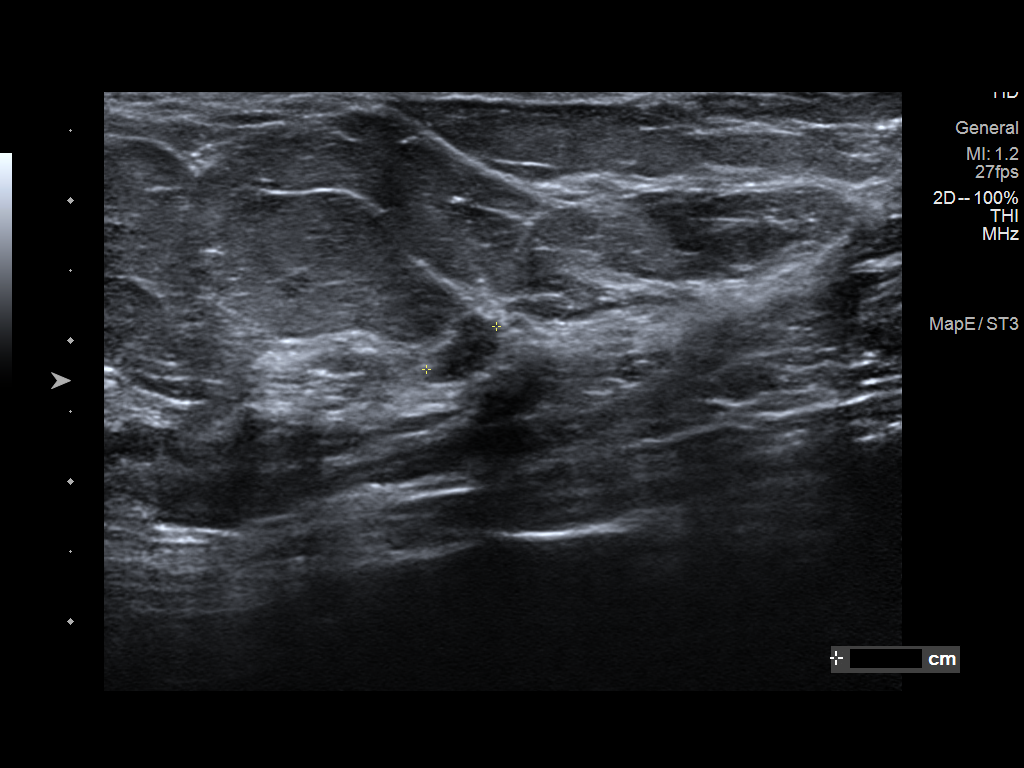

[6 of 6 positions shown; findings below may reference images not displayed]

ACR Breast Density Category b: There are scattered areas of
fibroglandular density.
FINDINGS: Mammogram:

Right breast: Additional spot compression views were performed for a
questioned asymmetry in the lateral right breast. On the additional
imaging there is persistence of a small asymmetry which could
represent clumped fibroglandular tissue. No additional finding
identified in the remainder of the right breast.

Left breast: Stable appearance of an oval circumscribed mass
measuring 0.9 cm in the lower central left breast. No new suspicious
mass, distortion, or microcalcifications are identified to suggest
presence of malignancy.

Mammographic images were processed with CAD.

Ultrasound:

Targeted ultrasound is performed throughout the upper-outer quadrant
of the right breast. There is no suspicious cystic or solid mass. No
finding to correspond to the questioned asymmetry on mammogram.

Targeted ultrasound is performed in the left breast at 6 o'clock 2
cm from the nipple demonstrating an oval hypoechoic mass measuring
0.7 x 0.5 x 0.6 cm, previously measuring 0.7 x 0.4 x 0.6 cm.
IMPRESSION: Stable probably benign left breast mass measuring 0.7 cm. No
mammographic evidence of malignancy in the bilateral breasts.

RECOMMENDATION:
Diagnostic bilateral mammogram and left breast ultrasound in 1 year
to confirm 2 year stability of the probably benign left breast mass
and for annual exam of the right breast.

I have discussed the findings and recommendations with the patient.
If applicable, a reminder letter will be sent to the patient
regarding the next appointment.

BI-RADS CATEGORY  3: Probably benign.

## 2020-12-12 IMAGING — MG DIGITAL DIAGNOSTIC BILAT W/ TOMO W/ CAD
6 of 10 series · 6 of 30 positions shown · non-contrast
Comparison: Previous exam(s).

CLINICAL DATA: Patient presents for 1 year follow-up of a probably
benign left breast mass, and is also due for annual exam of the
right breast.

EXAM:
DIGITAL DIAGNOSTIC BILATERAL MAMMOGRAM WITH CAD AND TOMO
ULTRASOUND BILATERAL BREAST

[R CC synth-2D (1 of 2)]
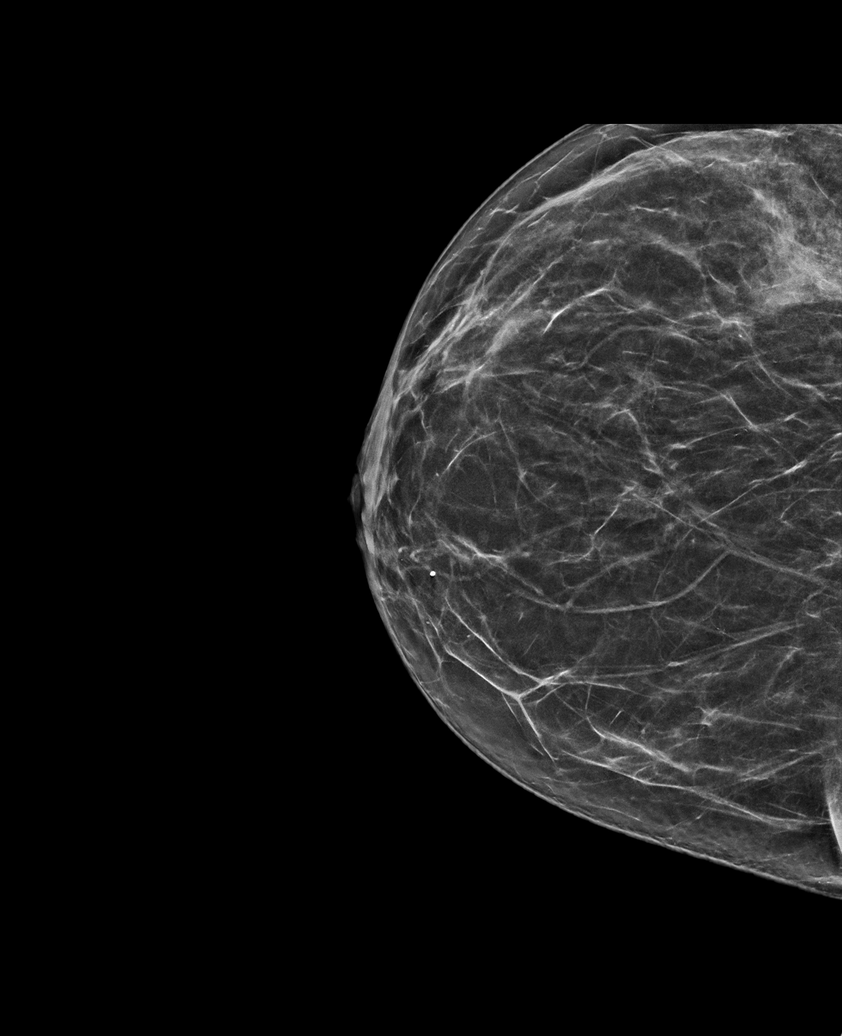

[R CC synth-2D (2 of 2)]
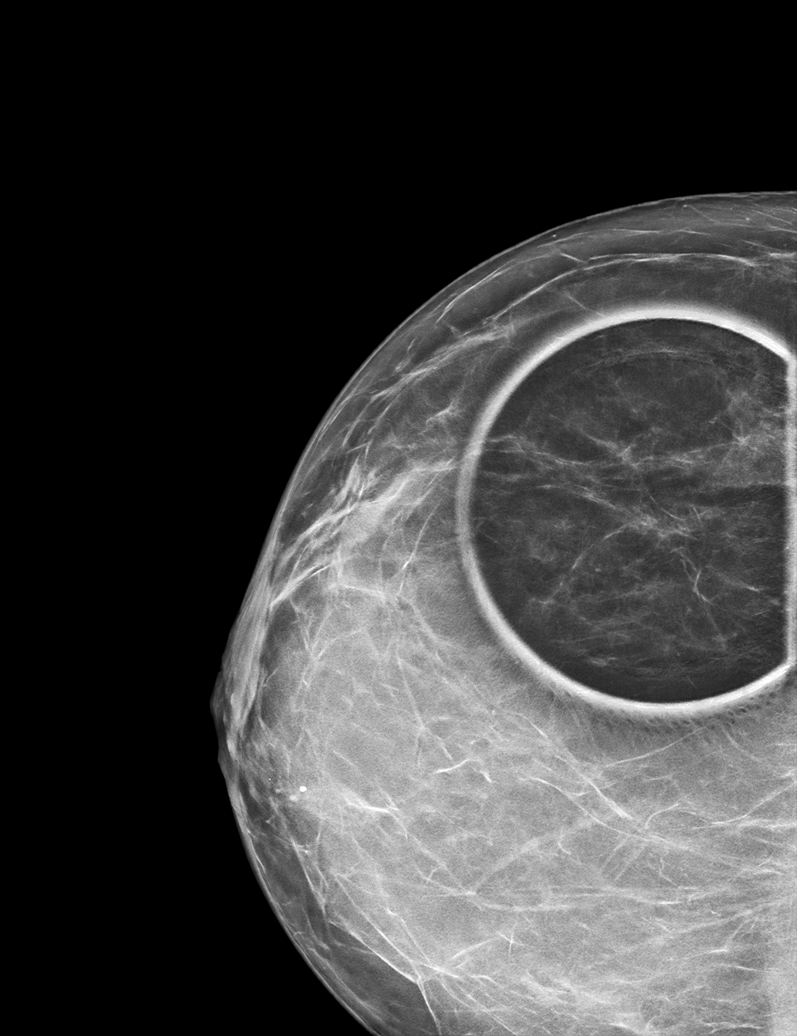

[R MLO synth-2D]
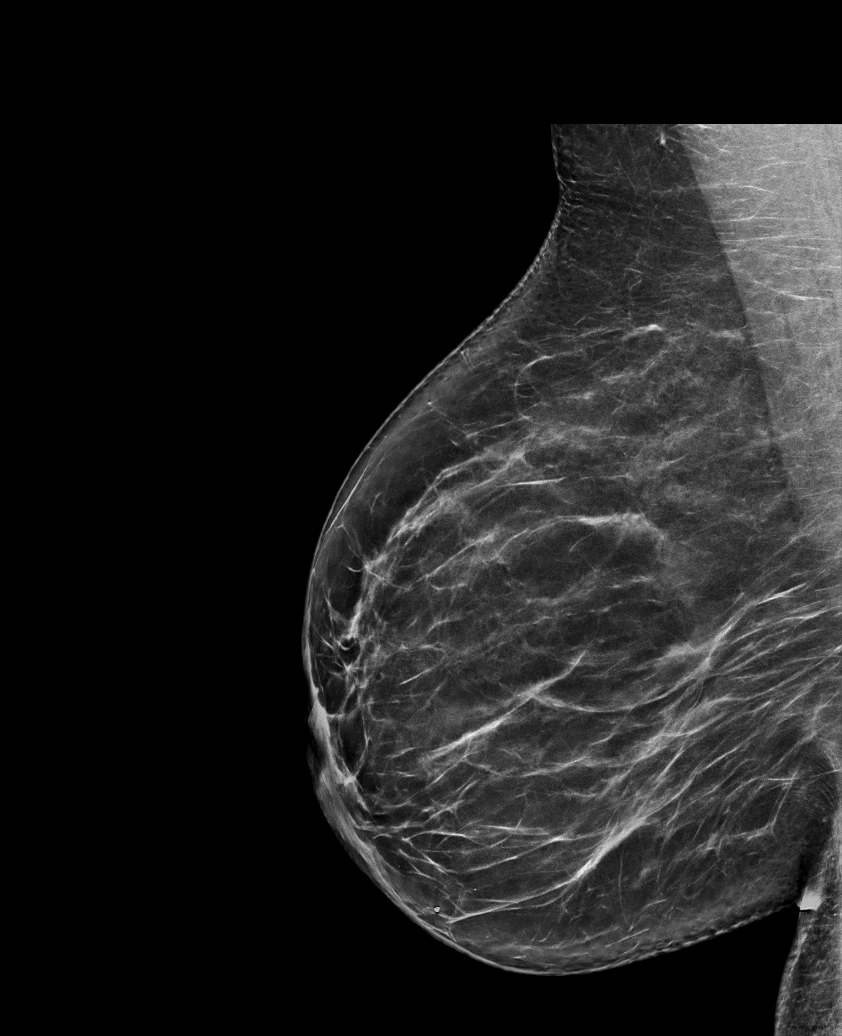

[L CC synth-2D]
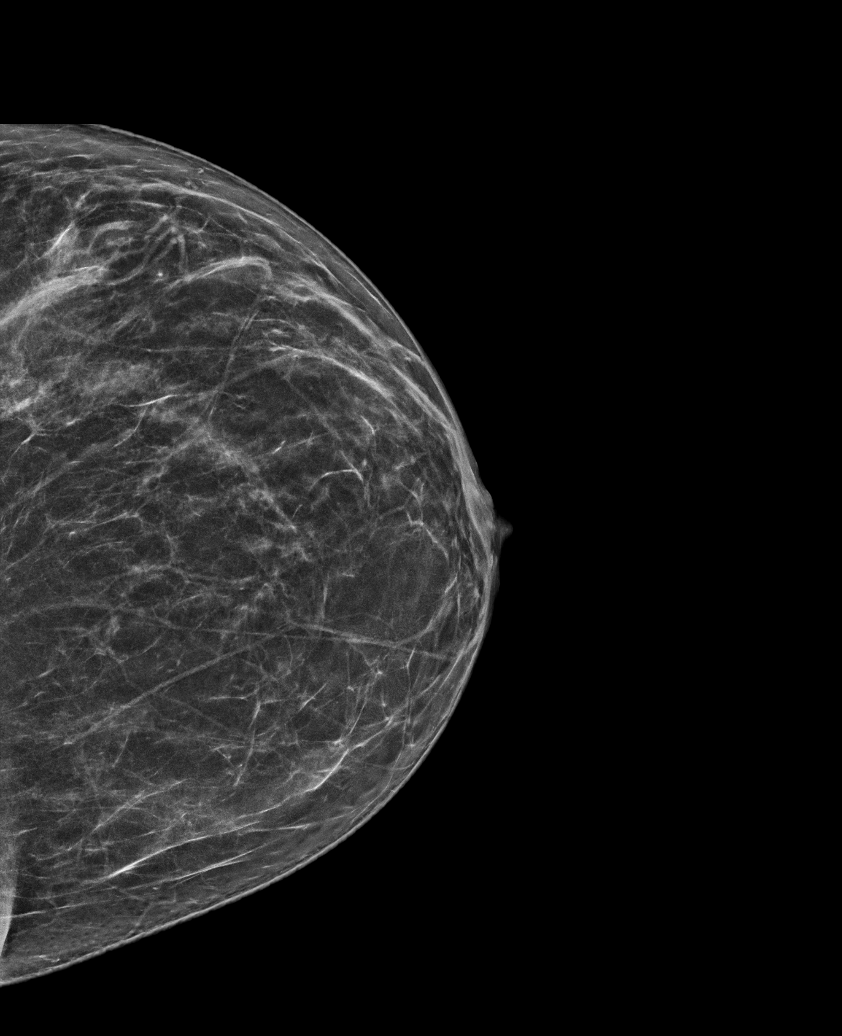

[L MLO synth-2D]
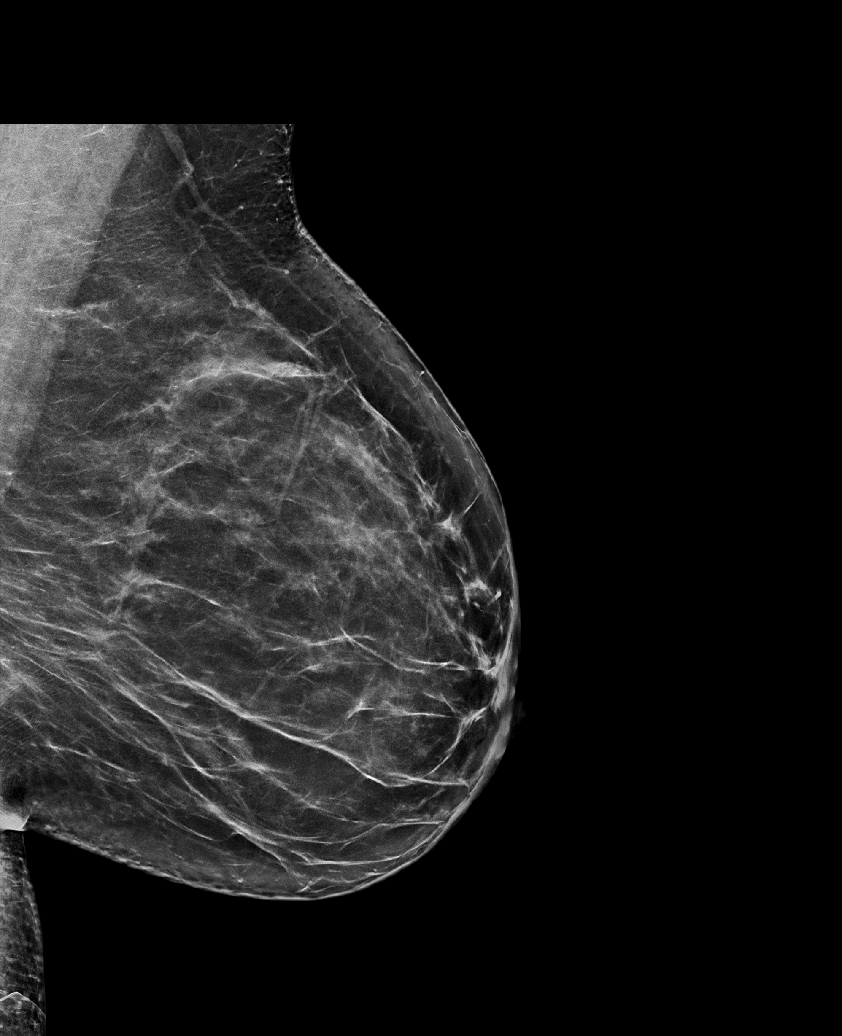

[R CC tomo · tomo slice 35/70.0]
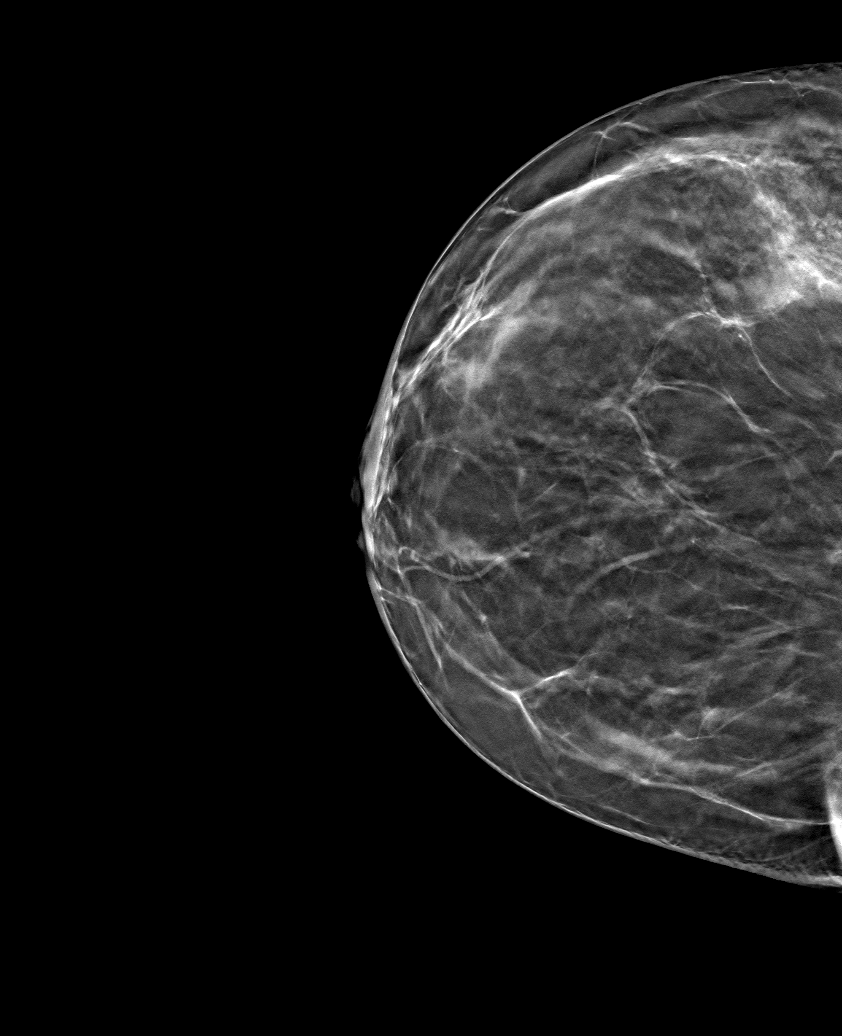

[6 of 30 positions shown; findings below may reference images not displayed]

ACR Breast Density Category b: There are scattered areas of
fibroglandular density.
FINDINGS: Mammogram:

Right breast: Additional spot compression views were performed for a
questioned asymmetry in the lateral right breast. On the additional
imaging there is persistence of a small asymmetry which could
represent clumped fibroglandular tissue. No additional finding
identified in the remainder of the right breast.

Left breast: Stable appearance of an oval circumscribed mass
measuring 0.9 cm in the lower central left breast. No new suspicious
mass, distortion, or microcalcifications are identified to suggest
presence of malignancy.

Mammographic images were processed with CAD.

Ultrasound:

Targeted ultrasound is performed throughout the upper-outer quadrant
of the right breast. There is no suspicious cystic or solid mass. No
finding to correspond to the questioned asymmetry on mammogram.

Targeted ultrasound is performed in the left breast at 6 o'clock 2
cm from the nipple demonstrating an oval hypoechoic mass measuring
0.7 x 0.5 x 0.6 cm, previously measuring 0.7 x 0.4 x 0.6 cm.
IMPRESSION: Stable probably benign left breast mass measuring 0.7 cm. No
mammographic evidence of malignancy in the bilateral breasts.

RECOMMENDATION:
Diagnostic bilateral mammogram and left breast ultrasound in 1 year
to confirm 2 year stability of the probably benign left breast mass
and for annual exam of the right breast.

I have discussed the findings and recommendations with the patient.
If applicable, a reminder letter will be sent to the patient
regarding the next appointment.

BI-RADS CATEGORY  3: Probably benign.

## 2021-09-25 ENCOUNTER — Telehealth: Payer: Self-pay | Admitting: *Deleted

## 2021-09-25 ENCOUNTER — Other Ambulatory Visit: Payer: Self-pay | Admitting: *Deleted

## 2021-09-25 DIAGNOSIS — B3731 Acute candidiasis of vulva and vagina: Secondary | ICD-10-CM

## 2021-09-25 MED ORDER — FLUCONAZOLE 150 MG PO TABS: 150.0000 mg | ORAL_TABLET | Freq: Once | ORAL | 0 refills | Status: AC

## 2021-09-25 NOTE — Telephone Encounter (Signed)
Message from patient regarding symptoms of yeast infection. RX Diflucan sent. Advised follow up one week if not better. ?

## 2022-04-23 ENCOUNTER — Encounter (INDEPENDENT_AMBULATORY_CARE_PROVIDER_SITE_OTHER): Payer: Self-pay | Admitting: Family Medicine

## 2022-04-23 ENCOUNTER — Ambulatory Visit (INDEPENDENT_AMBULATORY_CARE_PROVIDER_SITE_OTHER): Payer: No Typology Code available for payment source | Admitting: Family Medicine

## 2022-04-23 VITALS — BP 120/81 | HR 63 | Temp 97.9°F | Ht 65.0 in | Wt 210.0 lb

## 2022-04-23 DIAGNOSIS — R7303 Prediabetes: Secondary | ICD-10-CM

## 2022-04-23 DIAGNOSIS — Z6834 Body mass index (BMI) 34.0-34.9, adult: Secondary | ICD-10-CM | POA: Diagnosis not present

## 2022-04-23 DIAGNOSIS — E669 Obesity, unspecified: Secondary | ICD-10-CM | POA: Diagnosis not present

## 2022-04-23 DIAGNOSIS — Z0289 Encounter for other administrative examinations: Secondary | ICD-10-CM

## 2022-04-23 DIAGNOSIS — I10 Essential (primary) hypertension: Secondary | ICD-10-CM | POA: Insufficient documentation

## 2022-04-24 ENCOUNTER — Encounter (INDEPENDENT_AMBULATORY_CARE_PROVIDER_SITE_OTHER): Payer: Self-pay | Admitting: Family Medicine

## 2022-04-24 ENCOUNTER — Ambulatory Visit (INDEPENDENT_AMBULATORY_CARE_PROVIDER_SITE_OTHER): Payer: No Typology Code available for payment source | Admitting: Family Medicine

## 2022-04-24 VITALS — BP 110/74 | HR 70 | Temp 98.0°F | Ht 65.0 in | Wt 210.0 lb

## 2022-04-24 DIAGNOSIS — Z1331 Encounter for screening for depression: Secondary | ICD-10-CM | POA: Diagnosis not present

## 2022-04-24 DIAGNOSIS — E7849 Other hyperlipidemia: Secondary | ICD-10-CM | POA: Diagnosis not present

## 2022-04-24 DIAGNOSIS — E559 Vitamin D deficiency, unspecified: Secondary | ICD-10-CM | POA: Diagnosis not present

## 2022-04-24 DIAGNOSIS — R7303 Prediabetes: Secondary | ICD-10-CM | POA: Diagnosis not present

## 2022-04-24 DIAGNOSIS — E669 Obesity, unspecified: Secondary | ICD-10-CM

## 2022-04-24 DIAGNOSIS — R5383 Other fatigue: Secondary | ICD-10-CM | POA: Insufficient documentation

## 2022-04-24 DIAGNOSIS — R0602 Shortness of breath: Secondary | ICD-10-CM

## 2022-04-24 DIAGNOSIS — I10 Essential (primary) hypertension: Secondary | ICD-10-CM | POA: Diagnosis not present

## 2022-04-24 DIAGNOSIS — Z6835 Body mass index (BMI) 35.0-35.9, adult: Secondary | ICD-10-CM

## 2022-04-24 LAB — EKG 12-LEAD

## 2022-04-25 LAB — COMPREHENSIVE METABOLIC PANEL
ALT: 24 IU/L (ref 0–32)
AST: 18 IU/L (ref 0–40)
Albumin/Globulin Ratio: 1.8 (ref 1.2–2.2)
Albumin: 4.8 g/dL (ref 3.9–4.9)
Alkaline Phosphatase: 95 IU/L (ref 44–121)
BUN/Creatinine Ratio: 16 (ref 9–23)
BUN: 14 mg/dL (ref 6–24)
Bilirubin Total: 0.3 mg/dL (ref 0.0–1.2)
CO2: 26 mmol/L (ref 20–29)
Calcium: 10.3 mg/dL — ABNORMAL HIGH (ref 8.7–10.2)
Chloride: 96 mmol/L (ref 96–106)
Creatinine, Ser: 0.85 mg/dL (ref 0.57–1.00)
Globulin, Total: 2.7 g/dL (ref 1.5–4.5)
Glucose: 109 mg/dL — ABNORMAL HIGH (ref 70–99)
Potassium: 4.3 mmol/L (ref 3.5–5.2)
Sodium: 137 mmol/L (ref 134–144)
Total Protein: 7.5 g/dL (ref 6.0–8.5)
eGFR: 87 mL/min/{1.73_m2} (ref >59)

## 2022-04-25 LAB — LIPID PANEL
Chol/HDL Ratio: 5.6 ratio — ABNORMAL HIGH (ref 0.0–4.4)
Cholesterol, Total: 269 mg/dL — ABNORMAL HIGH (ref 100–199)
HDL: 48 mg/dL (ref >39)
LDL Chol Calc (NIH): 186 mg/dL — ABNORMAL HIGH (ref 0–99)
Triglycerides: 188 mg/dL — ABNORMAL HIGH (ref 0–149)
VLDL Cholesterol Cal: 35 mg/dL (ref 5–40)

## 2022-04-25 LAB — CBC WITH DIFFERENTIAL/PLATELET
Basophils Absolute: 0 10*3/uL (ref 0.0–0.2)
Basos: 0 %
EOS (ABSOLUTE): 0 10*3/uL (ref 0.0–0.4)
Eos: 0 %
Hematocrit: 48.2 % — ABNORMAL HIGH (ref 34.0–46.6)
Hemoglobin: 16.5 g/dL — ABNORMAL HIGH (ref 11.1–15.9)
Immature Grans (Abs): 0 10*3/uL (ref 0.0–0.1)
Immature Granulocytes: 0 %
Lymphocytes Absolute: 2.3 10*3/uL (ref 0.7–3.1)
Lymphs: 32 %
MCH: 31.5 pg (ref 26.6–33.0)
MCHC: 34.2 g/dL (ref 31.5–35.7)
MCV: 92 fL (ref 79–97)
Monocytes Absolute: 0.4 10*3/uL (ref 0.1–0.9)
Monocytes: 5 %
Neutrophils Absolute: 4.6 10*3/uL (ref 1.4–7.0)
Neutrophils: 63 %
Platelets: 309 10*3/uL (ref 150–450)
RBC: 5.24 x10E6/uL (ref 3.77–5.28)
RDW: 12.4 % (ref 11.7–15.4)
WBC: 7.4 10*3/uL (ref 3.4–10.8)

## 2022-04-25 LAB — FOLATE: Folate: >20 ng/mL (ref >3.0)

## 2022-04-25 LAB — T4, FREE: Free T4: 1.42 ng/dL (ref 0.82–1.77)

## 2022-04-25 LAB — VITAMIN D 25 HYDROXY (VIT D DEFICIENCY, FRACTURES): Vit D, 25-Hydroxy: 40 ng/mL (ref 30.0–100.0)

## 2022-04-25 LAB — HEMOGLOBIN A1C
Est. average glucose Bld gHb Est-mCnc: 128 mg/dL
Hgb A1c MFr Bld: 6.1 % — ABNORMAL HIGH (ref 4.8–5.6)

## 2022-04-25 LAB — VITAMIN B12: Vitamin B-12: 521 pg/mL (ref 232–1245)

## 2022-04-25 LAB — TSH: TSH: 1.16 u[IU]/mL (ref 0.450–4.500)

## 2022-04-25 LAB — INSULIN, RANDOM: INSULIN: 14.5 u[IU]/mL (ref 2.6–24.9)

## 2022-05-02 NOTE — Progress Notes (Signed)
Office: [phone]  /  Fax: [phone]   Initial Visit  Rebekah Wright was seen in clinic today to evaluate for obesity. She is interested in losing weight to improve overall health and reduce the risk of weight related complications. She presents today to review program treatment options, initial physical assessment, and evaluation.     She was referred by: PCP  When asked what else they would like to accomplish? She states: Improve existing medical conditions and Improve appearance  When asked how has your weight affected you? 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   Weight promoting medications identified: None  Current nutrition plan: she has reduced red meat and cut out fried foods  Current level of physical activity: Walking at work.   Current or previous pharmacotherapy: None  Response to medication: Never tried medications   Past medical history includes:   Past Medical History:  Diagnosis Date   Anxiety    High cholesterol    Hypertension    Joint pain    Kidney stone    Pre-diabetes    Tonsil pain    Vitamin D deficiency      Objective:   BP 120/81   Pulse 63   Temp 97.9 F (36.6 C)   Ht 5\' 5"  (1.651 m)   Wt 210 lb (95.3 kg)   SpO2 99%   BMI 34.95 kg/m  She was weighed on the bioimpedance scale: Body mass index is 34.95 kg/m.  Visceral Fat Rating:10, Body Fat%:41.6  General:  Alert, oriented and cooperative. Patient is in no acute distress.  Respiratory: Normal respiratory effort, no problems with respiration noted  Extremities: Normal range of motion.    Mental Status: Normal mood and affect. Normal behavior. Normal judgment and thought content.   Assessment and Plan:  1. Pre-diabetes Reports A1c 6.1 in May. Positive family history of T2DM.  Reports positive  history of gestational diabetes mellitus.   Plan to recheck labs at next office visit.   2. Essential hypertension Blood pressure is well controlled on HCTZ 25 mg and lisinopril 20 mg daily.  She tolerates well without adverse side effects.    Continue current blood pressure medications, looking for improvements with weight loss.   3. Obesity,current BMI 34.9 1) Reviewed obesity as a disease. 2) Reviewed information about our program. 3 Reviewed today's Bioimpedance results.   We reviewed weight, biometrics, associated medical conditions and contributing factors with patient. She would benefit from weight loss therapy via a modified calorie, low-carb, high-protein nutritional plan tailored to their REE (resting energy expenditure) which will be determined by indirect calorimetry.  We will also assess for cardiometabolic risk and nutritional derangements via fasting serologies at her next appointment.     Obesity Treatment / Action Plan:  Patient will work on garnering support from family and friends to begin weight loss journey. Will work on eliminating or reducing the presence of highly palatable, calorie dense foods in the home. Will complete provided nutritional and psychosocial assessment questionnaire before the next appointment. Will be scheduled for indirect calorimetry to determine resting energy expenditure in a fasting state.  This will allow June to create a reduced calorie, high-protein meal plan to promote loss of fat mass while preserving muscle mass.  Obesity Education Performed Today:  She was weighed on the bioimpedance scale and results were  discussed and documented in the synopsis.  We discussed obesity as a disease and the importance of a more detailed evaluation of all the factors contributing to the disease.  We discussed the importance of long term lifestyle changes which include nutrition, exercise and behavioral modifications as well as the importance of  customizing this to her specific health and social needs.  We discussed the benefits of reaching a healthier weight to alleviate the symptoms of existing conditions and reduce the risks of the biomechanical, metabolic and psychological effects of obesity.  [name] appears to be in the action stage of change and states they are ready to start intensive lifestyle modifications and behavioral modifications.  30 minutes was spent today on this visit including the above counseling, pre-visit chart review, and post-visit documentation.  Reviewed by clinician on day of visit: allergies, medications, problem list, medical history, surgical history, family history, social history, and previous encounter notes.  I, Davy Pique, am acting as Location manager for Loyal Gambler, DO.   I have reviewed the above documentation for accuracy and completeness, and I agree with the above. Dell Ponto, DO

## 2022-05-08 ENCOUNTER — Ambulatory Visit (INDEPENDENT_AMBULATORY_CARE_PROVIDER_SITE_OTHER): Payer: No Typology Code available for payment source | Admitting: Family Medicine

## 2022-05-08 ENCOUNTER — Encounter (INDEPENDENT_AMBULATORY_CARE_PROVIDER_SITE_OTHER): Payer: Self-pay | Admitting: Family Medicine

## 2022-05-08 VITALS — BP 128/80 | HR 77 | Temp 98.6°F | Ht 65.0 in | Wt 206.0 lb

## 2022-05-08 DIAGNOSIS — K5909 Other constipation: Secondary | ICD-10-CM

## 2022-05-08 DIAGNOSIS — I10 Essential (primary) hypertension: Secondary | ICD-10-CM | POA: Diagnosis not present

## 2022-05-08 DIAGNOSIS — Z6834 Body mass index (BMI) 34.0-34.9, adult: Secondary | ICD-10-CM

## 2022-05-08 DIAGNOSIS — E559 Vitamin D deficiency, unspecified: Secondary | ICD-10-CM

## 2022-05-08 DIAGNOSIS — R7303 Prediabetes: Secondary | ICD-10-CM

## 2022-05-08 DIAGNOSIS — E7849 Other hyperlipidemia: Secondary | ICD-10-CM | POA: Diagnosis not present

## 2022-05-08 DIAGNOSIS — E669 Obesity, unspecified: Secondary | ICD-10-CM

## 2022-05-13 NOTE — Progress Notes (Unsigned)
Chief Complaint:   OBESITY Rebekah Wright (MR# [id number]) is a 46 y.o. female who presents for evaluation and treatment of obesity and related comorbidities. Current BMI is Body mass index is 34.95 kg/m. Rebekah Wright has been struggling with her weight for many years and has been unsuccessful in either losing weight, maintaining weight loss, or reaching her healthy weight goal.  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  She skips breakfast and gets a break at 2 pm to eat.  She eats [employer], 1/2 sandwich, 1/2 salad, no snacks.  She is home by 45 PM, 44 year old daughter cooks pasta or rice or chicken.  On days off work, she eats 3 meals, healthy snacks.  Likes veggies.  She drinks water, coffee and almond milk.  Occasionally sweet tea 1/2 cup.    Rebekah Wright is currently in the action stage of change and ready to dedicate time achieving and maintaining a healthier weight. Rebekah Wright is interested in becoming our patient and working on intensive lifestyle modifications including (but not limited to) diet and exercise for weight loss.  [name] habits were reviewed today and are as follows: Her family eats meals together, her desired weight loss is 60 lbs, she has been heavy most of her life, she started gaining weight at age 77, her heaviest weight ever was 217 pounds, she skips meals frequently, and she frequently eats larger portions than normal.  Depression Screen Rebekah Wright's Food and Mood (modified PHQ-9) score was 3.     04/24/2022    8:27 AM  Depression screen PHQ 2/9  Decreased Interest 0  Down, Depressed, Hopeless 1  PHQ - 2 Score 1  Altered sleeping 0  Tired, decreased energy 1  Change in appetite 1  Feeling bad or failure about yourself  0  Trouble concentrating 0  Moving slowly or fidgety/restless 0  Suicidal thoughts 0  PHQ-9 Score 3  Difficult doing work/chores Not difficult at all   Subjective:   1. Other fatigue Reviewed EKG and IC results.   Rebekah Wright admits  to daytime somnolence and admits to waking up still tired. Patient has a history of symptoms of daytime fatigue, morning fatigue, morning headache, and hypertension. Samhita generally gets 7 hours of sleep per night, and states that she has nightime awakenings. Snoring is present. Apneic episodes are not present. Epworth Sleepiness Score is 3.   2. SOBOE (shortness of breath on exertion) Rebekah Wright notes increasing shortness of breath with exercising and seems to be worsening over time with weight gain. She notes getting out of breath sooner with activity than she used to. This has not gotten worse recently. Rebekah Wright denies shortness of breath at rest or orthopnea.  3. Vitamin D deficiency She is on OTC Vitamin D, 2,000 IU daily.  Complains of fatigue.  4. Essential hypertension Blood pressure well controlled on Lisinopril/HCTZ 20-25 mg daily.  She has a positive family history of hypertension.   5. Other hyperlipidemia She is taking Omega 3 fish oil daily.  She has a positive family history of hyperlipidemia. Last LDL in May was 183.  Never on statin therapy.    6. Pre-diabetes Last A1c 6.1 in May.  Positive family history of T2DM.  Positive for diet controlled gestational diabetes with 2nd pregnancy.   Assessment/Plan:   1. Other fatigue Rebekah Wright does feel that her weight is causing her energy to be lower than it should be. Fatigue may be related to obesity, depression  or many other causes. Labs will be ordered, and in the meanwhile, Rebekah Wright will focus on self care including making healthy food choices, increasing physical activity and focusing on stress reduction.  Update labs today.   - EKG 12-Lead - TSH - T4, free - Folate - Comprehensive metabolic panel - Vitamin B12 - CBC with Differential/Platelet  2. SOBOE (shortness of breath on exertion) Rebekah Wright does feel that she gets out of breath more easily that she used to when she exercises. Rebekah Wright's shortness of breath appears to be obesity  related and exercise induced. She has agreed to work on weight loss and gradually increase exercise to treat her exercise induced shortness of breath. Will continue to monitor closely.   3. Vitamin D deficiency Check Vitamin D level today.   - VITAMIN D 25 Hydroxy (Vit-D Deficiency, Fractures)  4. Essential hypertension Continue current blood pressure medication per PCP.  Look for blood pressure improvements with weight loss.    Check labs today.  - Comprehensive metabolic panel  5. Other hyperlipidemia Check labs today.   - Lipid panel - Comprehensive metabolic panel  6. Pre-diabetes Begin working on dietary changes and increased exercise.   Check labs today.   - Insulin, random - Hemoglobin A1c  7. Depression screening Rebekah Wright had a negative depression screening. Depression is commonly associated with obesity and often results in emotional eating behaviors. We will monitor this closely and work on CBT to help improve the non-hunger eating patterns. Referral to Psychology may be required if no improvement is seen as she continues in our clinic.  8. Obesity,current BMI 35.0 Rebekah Wright is currently in the action stage of change and her goal is to continue with weight loss efforts. I recommend Rebekah Wright begin the structured treatment plan as follows:  She has agreed to the Category 2 Plan.  Exercise goals: She gets 10,000 steps on work days.    Behavioral modification strategies: increasing lean protein intake, increasing vegetables, increasing water intake, decreasing liquid calories, decreasing eating out, no skipping meals, meal planning and cooking strategies, and decreasing junk food.  She was informed of the importance of frequent follow-up visits to maximize her success with intensive lifestyle modifications for her multiple health conditions. She was informed we would discuss her lab results at her next visit unless there is a critical issue that needs to be addressed sooner.  Rebekah Wright agreed to keep her next visit at the agreed upon time to discuss these results.  Objective:   Blood pressure 110/74, pulse 70, temperature 98 F (36.7 C), height 5\' 5"  (1.651 m), weight 210 lb (95.3 kg), SpO2 99 %. Body mass index is 34.95 kg/m.  EKG: Normal sinus rhythm, rate 86 BPM.   Indirect Calorimeter completed today shows a VO2 of 247 and a REE of 1699.  Her calculated basal metabolic rate is thus her basal metabolic rate is worse than expected.  General: Cooperative, alert, well developed, in no acute distress. HEENT: Conjunctivae and lids unremarkable. Cardiovascular: Regular rhythm.  Lungs: Normal work of breathing. Neurologic: No focal deficits.   Lab Results  Component Value Date   CREATININE 0.85 04/24/2022   BUN 14 04/24/2022   NA 137 04/24/2022   K 4.3 04/24/2022   CL 96 04/24/2022   CO2 26 04/24/2022   Lab Results  Component Value Date   ALT 24 04/24/2022   AST 18 04/24/2022   ALKPHOS 95 04/24/2022   BILITOT 0.3 04/24/2022   Lab Results  Component Value Date  HGBA1C 6.1 (H) 04/24/2022   Lab Results  Component Value Date   INSULIN 14.5 04/24/2022   Lab Results  Component Value Date   TSH 1.160 04/24/2022   Lab Results  Component Value Date   CHOL 269 (H) 04/24/2022   HDL 48 04/24/2022   LDLCALC 186 (H) 04/24/2022   TRIG 188 (H) 04/24/2022   CHOLHDL 5.6 (H) 04/24/2022   Lab Results  Component Value Date   WBC 7.4 04/24/2022   HGB 16.5 (H) 04/24/2022   HCT 48.2 (H) 04/24/2022   MCV 92 04/24/2022   PLT 309 04/24/2022   No results found for: "IRON", "TIBC", "FERRITIN"  Attestation Statements:   Reviewed by clinician on day of visit: allergies, medications, problem list, medical history, surgical history, family history, social history, and previous encounter notes.  Time spent on visit including pre-visit chart review and post-visit charting and care was 40 minutes.   I, Malcolm Metro, am acting as Energy manager for Seymour Bars, DO.  I have reviewed the above documentation for accuracy and completeness, and I agree with the above. Glennis Brink, DO

## 2022-05-20 NOTE — Progress Notes (Signed)
Chief Complaint:   OBESITY Rebekah Wright is here to discuss her progress with her obesity treatment plan along with follow-up of her obesity related diagnoses. [name] is on the Category 2 Plan and states she is following her eating plan approximately 100% of the time. [name] states she is walking 20-30 minutes 3 times per week.  Today's visit was #: 2 Starting weight: 210 lbs Starting date: 04/24/2022 Today's weight: 206 lbs Today's date: 05/08/2022 Total lbs lost to date: 4 lbs Total lbs lost since last in-office visit: 4 lbs  Interim History: Has seen doing well on meal plan.  Denies meal skipping.  Having a premier protein shake and fruit on work day.  Getting in fruits and veggies.  Drinking about 80 oz of water daily with an increase in constipation.  Started some outdoor walking.  Denies hunger or cravings.    Subjective:   1. Pre-diabetes Discussed labs with patient today. Has started to reduce intake of starches and sweets. Last A1c 6.1.  Fasting insulin 14.5.   2. Other hyperlipidemia Discussed labs with patient today. Total cholesterol 269, triglycerides 188, HDL 48, LDL 186.  Taking an Omega 3 fish oil supplement. Positive family history of HLD, never on statin therapy.    3. Essential hypertension Blood pressure well controlled on lisinopril/HCTZ 20/25 mg daily.   4. Other constipation Worsened by high protein diet.  Has taken colace 100 mg 1-2 times per week.   5. Vitamin D deficiency Discussed labs with patient today. She is on OTC Vitamin D 2,000 IU daily.  Vitamin D level 40, reviewed with patient.  Assessment/Plan:   1. Pre-diabetes Recheck A1c in 3 months, ramp up exercise by adding gym workout 2 times per week.   2. Other hyperlipidemia Recheck FLP in 3 months following healthy lifestyle changes.   3. Essential hypertension Continue lisinopril/HCTZ daily per PCP.   4. Other constipation Increased water intake to 90 oz per day.  Colace 100 mg 1-2  times a day, everyday.   5. Vitamin D deficiency Increase OTC Vitamin D to 4,000 IU daily.  Recheck level in 3 months.   6. Obesity,current BMI 34.3 Reviewed ways to increase protein in the morning on non work days.  (Adding egg whites or cottage cheese to scrambled eggs)  Rebekah Wright is currently in the action stage of change. As such, her goal is to continue with weight loss efforts. She has agreed to the Category 2 Plan.   Exercise goals:  Has a gym membership.  Plans to go 2 times per week.   Behavioral modification strategies: increasing vegetables, increasing water intake, increasing high fiber foods, decreasing eating out, no skipping meals, meal planning and cooking strategies, ways to avoid boredom eating, better snacking choices, and avoiding temptations.  Rebekah Wright has agreed to follow-up with our clinic in 2 weeks. She was informed of the importance of frequent follow-up visits to maximize her success with intensive lifestyle modifications for her multiple health conditions.   Objective:   Blood pressure 128/80, pulse 77, temperature 98.6 F (37 C), height 5\' 5"  (1.651 m), weight 206 lb (93.4 kg), SpO2 100 %. Body mass index is 34.28 kg/m.  General: Cooperative, alert, well developed, in no acute distress. HEENT: Conjunctivae and lids unremarkable. Cardiovascular: Regular rhythm.  Lungs: Normal work of breathing. Neurologic: No focal deficits.   Lab Results  Component Value Date   CREATININE 0.85 04/24/2022   BUN 14 04/24/2022   NA 137 04/24/2022   K 4.3  04/24/2022   CL 96 04/24/2022   CO2 26 04/24/2022   Lab Results  Component Value Date   ALT 24 04/24/2022   AST 18 04/24/2022   ALKPHOS 95 04/24/2022   BILITOT 0.3 04/24/2022   Lab Results  Component Value Date   HGBA1C 6.1 (H) 04/24/2022   Lab Results  Component Value Date   INSULIN 14.5 04/24/2022   Lab Results  Component Value Date   TSH 1.160 04/24/2022   Lab Results  Component Value Date   CHOL 269  (H) 04/24/2022   HDL 48 04/24/2022   LDLCALC 186 (H) 04/24/2022   TRIG 188 (H) 04/24/2022   CHOLHDL 5.6 (H) 04/24/2022   Lab Results  Component Value Date   VD25OH 40.0 04/24/2022   Lab Results  Component Value Date   WBC 7.4 04/24/2022   HGB 16.5 (H) 04/24/2022   HCT 48.2 (H) 04/24/2022   MCV 92 04/24/2022   PLT 309 04/24/2022   No results found for: "IRON", "TIBC", "FERRITIN"  Attestation Statements:   Reviewed by clinician on day of visit: allergies, medications, problem list, medical history, surgical history, family history, social history, and previous encounter notes.  I, Malcolm Metro, am acting as Energy manager for Seymour Bars, DO.  I have reviewed the above documentation for accuracy and completeness, and I agree with the above. Glennis Brink, DO

## 2022-05-22 ENCOUNTER — Encounter (INDEPENDENT_AMBULATORY_CARE_PROVIDER_SITE_OTHER): Payer: Self-pay | Admitting: Family Medicine

## 2022-05-22 ENCOUNTER — Ambulatory Visit (INDEPENDENT_AMBULATORY_CARE_PROVIDER_SITE_OTHER): Payer: No Typology Code available for payment source | Admitting: Family Medicine

## 2022-05-22 VITALS — BP 122/80 | HR 75 | Temp 98.0°F | Ht 65.0 in | Wt 200.0 lb

## 2022-05-22 DIAGNOSIS — E559 Vitamin D deficiency, unspecified: Secondary | ICD-10-CM | POA: Diagnosis not present

## 2022-05-22 DIAGNOSIS — E7849 Other hyperlipidemia: Secondary | ICD-10-CM | POA: Diagnosis not present

## 2022-05-22 DIAGNOSIS — R7303 Prediabetes: Secondary | ICD-10-CM

## 2022-05-22 DIAGNOSIS — K5909 Other constipation: Secondary | ICD-10-CM

## 2022-05-22 DIAGNOSIS — Z6833 Body mass index (BMI) 33.0-33.9, adult: Secondary | ICD-10-CM

## 2022-05-22 DIAGNOSIS — Z6834 Body mass index (BMI) 34.0-34.9, adult: Secondary | ICD-10-CM

## 2022-05-22 DIAGNOSIS — E669 Obesity, unspecified: Secondary | ICD-10-CM

## 2022-06-04 NOTE — Progress Notes (Signed)
Chief Complaint:   OBESITY Rebekah Wright is here to discuss her progress with her obesity treatment plan along with follow-up of her obesity related diagnoses. Rebekah Wright is on the Category 2 Plan and states she is following her eating plan approximately 100% of the time. Rebekah Wright states she is walking 30 minutes 6 times per week.  Today's visit was #: 3 Starting weight: 210 lbs Starting date: 04/24/2022 Today's weight: 200 lbs Today's date: 05/22/2022 Total lbs lost to date: 10 Total lbs lost since last in-office visit: 6  Interim History: Rebekah Wright is doing well with meal plan and meal prep. She denies hunger or cravings and is drinking more water. Pt is tracking her steps and increased to 10,000-15,000 steps a day. She plans to start weight training at the gym.  Subjective:   1. Pre-diabetes Rebekah Wright 04/24/22 A1c was 6.1 with an insulin level of 14.5. She has lost 10 lbs in 4 weeks with 4.7% total body weight loss. She has reduced starches/sweets and ramped up exercise.  2. Other hyperlipidemia Pt's last LDL was 186 and she is not on lipid-lowering therapy. She has reduced saturated fat intake. ASCVD 6.8%.  The 10-year ASCVD risk score (Arnett DK, et al., 2019) is: 6.8%   Values used to calculate the score:     Age: 46 years     Sex: Female     Is Non-Hispanic African American: No     Diabetic: No     Tobacco smoker: Yes     Systolic Blood Pressure: 122 mmHg     Is BP treated: Yes     HDL Cholesterol: 48 mg/dL     Total Cholesterol: 269 mg/dL  3. Vitamin D deficiency Rebekah Wright's last Vit D level was 40. She started OTC Vitamin D 4,000 IU daily, and her energy level is improving.  4. Other constipation Symptoms have improved with the use of Colace 100 mg 1-2 times a day. Rebekah Wright has to increase her water intake, but she does get in fruits and vegetables daily.  Assessment/Plan:   1. Pre-diabetes Rebekah Wright will continue to work on weight loss, exercise, and decreasing simple  carbohydrates to help decrease the risk of diabetes.  Repeat A1c and fasting insulin in February 2024.  2. Other hyperlipidemia Cardiovascular risk and specific lipid/LDL goals reviewed.  We discussed several lifestyle modifications today and Rebekah Wright will continue to work on diet, exercise and weight loss efforts. Orders and follow up as documented in patient record.  Repeat labs in February.  Counseling Intensive lifestyle modifications are the first line treatment for this issue. Dietary changes: Increase soluble fiber. Decrease simple carbohydrates. Exercise changes: Moderate to vigorous-intensity aerobic activity 150 minutes per week if tolerated. Lipid-lowering medications: see documented in medical record.  3. Vitamin D deficiency Low Vitamin D level contributes to fatigue and are associated with obesity, breast, and colon cancer. She agrees to continue OTC Vitamin D 4,000 IU daily and will follow-up for routine testing of Vitamin D, at least 2-3 times per year to avoid over-replacement. Recheck Vitamin D level in February 2024.  4. Other constipation Rebekah Wright was informed that a decrease in bowel movement frequency is normal while losing weight, but stools should not be hard or painful. Orders and follow up as documented in patient record.  Continue Colace as needed.  Counseling Getting to Good Bowel Health: Your goal is to have one soft bowel movement each day. Drink at least 8 glasses of water each day. Eat plenty of fiber (goal  is over 25 grams each day). It is best to get most of your fiber from dietary sources which includes leafy green vegetables, fresh fruit, and whole grains. You may need to add fiber with the help of OTC fiber supplements. These include Metamucil, Citrucel, and Flaxseed. If you are still having trouble, try adding Miralax or Magnesium Citrate. If all of these changes do not work, Dietitian.  5. Obesity,current BMI 33.4 Rebekah Wright is currently in the  action stage of change. As such, her goal is to continue with weight loss efforts. She has agreed to the Category 2 Plan.   Increase water intake to more than 64 oz per day. Increase of 1.2 lbs of muscle mass and decrease of 6.6 lbs of fat mass in 3 weeks.  Exercise goals:  Add in weight training 2 days a week.  Behavioral modification strategies: increasing lean protein intake, increasing vegetables, increasing water intake, no skipping meals, meal planning and cooking strategies, keeping healthy foods in the home, holiday eating strategies , and planning for success.  Rebekah Wright has agreed to follow-up with our clinic in 3-4 weeks. She was informed of the importance of frequent follow-up visits to maximize her success with intensive lifestyle modifications for her multiple health conditions.   Objective:   Blood pressure 122/80, pulse 75, temperature 98 F (36.7 C), height 5\' 5"  (1.651 m), weight 200 lb (90.7 kg), SpO2 99 %. Body mass index is 33.28 kg/m.  General: Cooperative, alert, well developed, in no acute distress. HEENT: Conjunctivae and lids unremarkable. Cardiovascular: Regular rhythm.  Lungs: Normal work of breathing. Neurologic: No focal deficits.   Lab Results  Component Value Date   CREATININE 0.85 04/24/2022   BUN 14 04/24/2022   NA 137 04/24/2022   K 4.3 04/24/2022   CL 96 04/24/2022   CO2 26 04/24/2022   Lab Results  Component Value Date   ALT 24 04/24/2022   AST 18 04/24/2022   ALKPHOS 95 04/24/2022   BILITOT 0.3 04/24/2022   Lab Results  Component Value Date   HGBA1C 6.1 (H) 04/24/2022   Lab Results  Component Value Date   INSULIN 14.5 04/24/2022   Lab Results  Component Value Date   TSH 1.160 04/24/2022   Lab Results  Component Value Date   CHOL 269 (H) 04/24/2022   HDL 48 04/24/2022   LDLCALC 186 (H) 04/24/2022   TRIG 188 (H) 04/24/2022   CHOLHDL 5.6 (H) 04/24/2022   Lab Results  Component Value Date   VD25OH 40.0 04/24/2022   Lab  Results  Component Value Date   WBC 7.4 04/24/2022   HGB 16.5 (H) 04/24/2022   HCT 48.2 (H) 04/24/2022   MCV 92 04/24/2022   PLT 309 04/24/2022   Attestation Statements:   Reviewed by clinician on day of visit: allergies, medications, problem list, medical history, surgical history, family history, social history, and previous encounter notes.  I, 04/26/2022, BS, CMA, am acting as transcriptionist for Kyung Rudd, DO.   I have reviewed the above documentation for accuracy and completeness, and I agree with the above. Seymour Bars, DO

## 2022-06-19 ENCOUNTER — Ambulatory Visit (INDEPENDENT_AMBULATORY_CARE_PROVIDER_SITE_OTHER): Payer: No Typology Code available for payment source | Admitting: Family Medicine

## 2022-06-19 ENCOUNTER — Encounter (INDEPENDENT_AMBULATORY_CARE_PROVIDER_SITE_OTHER): Payer: Self-pay | Admitting: Family Medicine

## 2022-06-19 VITALS — BP 113/79 | HR 76 | Temp 97.9°F | Ht 65.0 in | Wt 192.0 lb

## 2022-06-19 DIAGNOSIS — E559 Vitamin D deficiency, unspecified: Secondary | ICD-10-CM | POA: Diagnosis not present

## 2022-06-19 DIAGNOSIS — E7849 Other hyperlipidemia: Secondary | ICD-10-CM | POA: Diagnosis not present

## 2022-06-19 DIAGNOSIS — R7303 Prediabetes: Secondary | ICD-10-CM | POA: Diagnosis not present

## 2022-06-19 DIAGNOSIS — Z6832 Body mass index (BMI) 32.0-32.9, adult: Secondary | ICD-10-CM

## 2022-06-19 DIAGNOSIS — E669 Obesity, unspecified: Secondary | ICD-10-CM | POA: Diagnosis not present

## 2022-06-26 NOTE — Progress Notes (Signed)
Chief Complaint:   OBESITY Rebekah Wright is here to discuss her progress with her obesity treatment plan along with follow-up of her obesity related diagnoses. Rebekah Wright is on the Category 2 Plan and states she is following her eating plan approximately 100% of the time. Rebekah Wright states she is working out at [employer] 30-60 minutes 3 times per week.  Today's visit was #: 4 Starting weight: 210 LBS Starting date: 04/24/2022 Today's weight: 192 lbs Today's date: 06/19/2022 Total lbs lost to date: 18 lbs Total lbs lost since last in-office visit: 8 lbs  Interim History: Rebekah Wright eating when eating out.  Sticking to meal plan.  Tracking steps.  Has 1 rest day per week.  Going to the gym 3 times a week doing weight training.  She denies hunger or cravings.  Subjective:   1. Pre-diabetes Last A1c 6.1 04/24/2022.  Patient declined the use of metformin.  She is actively working on reducing sugar intake, regular exercise, has lost 18 pounds in 2 months.  2. Other hyperlipidemia LDL 186, April 24, 2022.  Actively losing weight, down 8.5% TBW loss with a low saturated fat diet.  The 10-year ASCVD risk score (Arnett DK, et al., 2019) is: 5.8%   Values used to calculate the score:     Age: 46 years     Sex: Female     Is Non-Hispanic African American: No     Diabetic: No     Tobacco smoker: Yes     Systolic Blood Pressure: 113 mmHg     Is BP treated: Yes     HDL Cholesterol: 48 mg/dL     Total Cholesterol: 269 mg/dL   3. Vitamin D deficiency Vitamin D level 40 on April 24, 2022.  Patient is on OTC vitamin D 4000 IU daily.  Reports improved energy levels.  Assessment/Plan:   1. Pre-diabetes Recheck A1c in 2 months, aim for greater than 10,000 steps per day.  2. Other hyperlipidemia Recheck FLP in February 2024, consider statin if not improving.  Currently in low risk category.  3. Vitamin D deficiency Recheck vitamin D level in February.  Continue OTC vitamin D 4000 IU daily.  4.  Obesity,current BMI 32.0 Reviewed overall progress: Down 18 pounds in 2 months = 8.5% TBW.  Visceral fat rating from 11 to 8.  Rebekah Wright is currently in the action stage of change. As such, her goal is to continue with weight loss efforts. She has agreed to the Category 2 Plan.   Exercise goals:  As is.  Behavioral modification strategies: increasing lean protein intake, increasing vegetables, increasing water intake, increasing high fiber foods, meal planning and cooking strategies, keeping healthy foods in the home, holiday eating strategies , and planning for success.  Rebekah Wright has agreed to follow-up with our clinic in 4 weeks. She was informed of the importance of frequent follow-up visits to maximize her success with intensive lifestyle modifications for her multiple health conditions.   Objective:   Blood pressure 113/79, pulse 76, temperature 97.9 F (36.6 C), height 5\' 5"  (1.651 m), weight 192 lb (87.1 kg), SpO2 98 %. Body mass index is 31.95 kg/m.  General: Cooperative, alert, well developed, in no acute distress. HEENT: Conjunctivae and lids unremarkable. Cardiovascular: Regular rhythm.  Lungs: Normal work of breathing. Neurologic: No focal deficits.   Lab Results  Component Value Date   CREATININE 0.85 04/24/2022   BUN 14 04/24/2022   NA 137 04/24/2022   K 4.3 04/24/2022   CL  96 04/24/2022   CO2 26 04/24/2022   Lab Results  Component Value Date   ALT 24 04/24/2022   AST 18 04/24/2022   ALKPHOS 95 04/24/2022   BILITOT 0.3 04/24/2022   Lab Results  Component Value Date   HGBA1C 6.1 (H) 04/24/2022   Lab Results  Component Value Date   INSULIN 14.5 04/24/2022   Lab Results  Component Value Date   TSH 1.160 04/24/2022   Lab Results  Component Value Date   CHOL 269 (H) 04/24/2022   HDL 48 04/24/2022   LDLCALC 186 (H) 04/24/2022   TRIG 188 (H) 04/24/2022   CHOLHDL 5.6 (H) 04/24/2022   Lab Results  Component Value Date   VD25OH 40.0 04/24/2022   Lab  Results  Component Value Date   WBC 7.4 04/24/2022   HGB 16.5 (H) 04/24/2022   HCT 48.2 (H) 04/24/2022   MCV 92 04/24/2022   PLT 309 04/24/2022   No results found for: "IRON", "TIBC", "FERRITIN"  Attestation Statements:   Reviewed by clinician on day of visit: allergies, medications, problem list, medical history, surgical history, family history, social history, and previous encounter notes.  I, Malcolm Metro, am acting as Energy manager for Seymour Bars, DO.  I have reviewed the above documentation for accuracy and completeness, and I agree with the above. Glennis Brink, DO

## 2022-07-17 ENCOUNTER — Ambulatory Visit (INDEPENDENT_AMBULATORY_CARE_PROVIDER_SITE_OTHER): Payer: 59 | Admitting: Family Medicine

## 2022-07-17 ENCOUNTER — Encounter (INDEPENDENT_AMBULATORY_CARE_PROVIDER_SITE_OTHER): Payer: Self-pay | Admitting: Family Medicine

## 2022-07-17 VITALS — BP 114/69 | HR 67 | Temp 98.3°F | Ht 65.0 in | Wt 188.0 lb

## 2022-07-17 DIAGNOSIS — R7303 Prediabetes: Secondary | ICD-10-CM

## 2022-07-17 DIAGNOSIS — Z6831 Body mass index (BMI) 31.0-31.9, adult: Secondary | ICD-10-CM | POA: Diagnosis not present

## 2022-07-17 DIAGNOSIS — E559 Vitamin D deficiency, unspecified: Secondary | ICD-10-CM | POA: Diagnosis not present

## 2022-07-17 DIAGNOSIS — E669 Obesity, unspecified: Secondary | ICD-10-CM

## 2022-07-17 DIAGNOSIS — E7800 Pure hypercholesterolemia, unspecified: Secondary | ICD-10-CM | POA: Diagnosis not present

## 2022-07-17 DIAGNOSIS — E509 Vitamin A deficiency, unspecified: Secondary | ICD-10-CM | POA: Insufficient documentation

## 2022-08-05 DIAGNOSIS — H9202 Otalgia, left ear: Secondary | ICD-10-CM | POA: Diagnosis not present

## 2022-08-05 DIAGNOSIS — H6590 Unspecified nonsuppurative otitis media, unspecified ear: Secondary | ICD-10-CM | POA: Diagnosis not present

## 2022-08-08 NOTE — Progress Notes (Signed)
Chief Complaint:   OBESITY Rebekah Wright is here to discuss her progress with her obesity treatment plan along with follow-up of her obesity related diagnoses. Rebekah Wright is on the Category 2 Plan and states she is following her eating plan approximately 100% of the time. Rebekah Wright states she is gym workouts 60 minutes 3-4 times per week.  Today's visit was #: 5 Starting weight: 210 LBS Starting date: 04/24/2022 Today's weight: 188 LBS Today's date: 07/17/2022 Total lbs lost to date: 22 LBS Total lbs lost since last in-office visit: 4 LBS  Interim History: Patient had COVID-19 2 weeks ago.  Using crockpot for meal prep.  Eating plan.  Patient denies hunger or cravings. snacks on cottage cheese.  Patient is trying to hydrate better and getting about 67 ounces of water per day.  has free weights for home use getting about 10,000 steps a day.  Subjective:   1. Hypercholesteremia On 04/24/2022, LDL 186.  Patient has been working on prescribed diet, exercise and weight loss.    2. Pre-diabetes A1c 6.1 on 04/24/2022.  Patient has reduced the intake of sweets and ramped up exercise.   3. Vitamin D deficiency 04/24/2022, Vitamin D level was 40. Patient is taking OTC Vitamin D 4,000 IU daily.    Assessment/Plan:   1. Hypercholesteremia Recheck FLP in 4 weeks.   2. Pre-diabetes Recheck A1c in 4 weeks.   3. Vitamin D deficiency Recheck Vitamin D level in 4 weeks.   4. Obesity,current BMI 31.4 Resume gym workouts 3-4 times per week.  Aim for 80+ ounces of water per day.   Rebekah Wright is currently in the action stage of change. As such, her goal is to continue with weight loss efforts. She has agreed to the Category 2 Plan.   Exercise goals:  As is.   Behavioral modification strategies: increasing lean protein intake, decreasing simple carbohydrates, increasing vegetables, increasing water intake, meal planning and cooking strategies, keeping healthy foods in the home, and planning for  success.  Rebekah Wright has agreed to follow-up with our clinic in 4 weeks. She was informed of the importance of frequent follow-up visits to maximize her success with intensive lifestyle modifications for her multiple health conditions.   Objective:   Blood pressure 114/69, pulse 67, temperature 98.3 F (36.8 C), height 5' 5"$  (1.651 m), weight 188 lb (85.3 kg), SpO2 99 %. Body mass index is 31.28 kg/m.  General: Cooperative, alert, well developed, in no acute distress. HEENT: Conjunctivae and lids unremarkable. Cardiovascular: Regular rhythm.  Lungs: Normal work of breathing. Neurologic: No focal deficits.   Lab Results  Component Value Date   CREATININE 0.85 04/24/2022   BUN 14 04/24/2022   NA 137 04/24/2022   K 4.3 04/24/2022   CL 96 04/24/2022   CO2 26 04/24/2022   Lab Results  Component Value Date   ALT 24 04/24/2022   AST 18 04/24/2022   ALKPHOS 95 04/24/2022   BILITOT 0.3 04/24/2022   Lab Results  Component Value Date   HGBA1C 6.1 (H) 04/24/2022   Lab Results  Component Value Date   INSULIN 14.5 04/24/2022   Lab Results  Component Value Date   TSH 1.160 04/24/2022   Lab Results  Component Value Date   CHOL 269 (H) 04/24/2022   HDL 48 04/24/2022   LDLCALC 186 (H) 04/24/2022   TRIG 188 (H) 04/24/2022   CHOLHDL 5.6 (H) 04/24/2022   Lab Results  Component Value Date   VD25OH 40.0 04/24/2022   Lab  Results  Component Value Date   WBC 7.4 04/24/2022   HGB 16.5 (H) 04/24/2022   HCT 48.2 (H) 04/24/2022   MCV 92 04/24/2022   PLT 309 04/24/2022   No results found for: "IRON", "TIBC", "FERRITIN"  Attestation Statements:   Reviewed by clinician on day of visit: allergies, medications, problem list, medical history, surgical history, family history, social history, and previous encounter notes.  I have personally spent 30  minutes total time today in preparation, patient care, and documentation for this visit, including the following: review of clinical lab  tests; review of medical tests/procedures/services and nutritional counseling.  I, Davy Pique, am acting as Location manager for Loyal Gambler, DO.  I have reviewed the above documentation for accuracy and completeness, and I agree with the above. Dell Ponto, DO

## 2022-08-14 ENCOUNTER — Ambulatory Visit (INDEPENDENT_AMBULATORY_CARE_PROVIDER_SITE_OTHER): Payer: 59 | Admitting: Family Medicine

## 2022-08-15 ENCOUNTER — Encounter (INDEPENDENT_AMBULATORY_CARE_PROVIDER_SITE_OTHER): Payer: Self-pay | Admitting: Family Medicine

## 2022-08-15 ENCOUNTER — Ambulatory Visit (INDEPENDENT_AMBULATORY_CARE_PROVIDER_SITE_OTHER): Payer: 59 | Admitting: Family Medicine

## 2022-08-15 VITALS — BP 104/66 | HR 67 | Temp 98.4°F | Ht 65.0 in | Wt 180.0 lb

## 2022-08-15 DIAGNOSIS — E559 Vitamin D deficiency, unspecified: Secondary | ICD-10-CM | POA: Diagnosis not present

## 2022-08-15 DIAGNOSIS — I10 Essential (primary) hypertension: Secondary | ICD-10-CM | POA: Diagnosis not present

## 2022-08-15 DIAGNOSIS — R7303 Prediabetes: Secondary | ICD-10-CM | POA: Diagnosis not present

## 2022-08-15 DIAGNOSIS — E7849 Other hyperlipidemia: Secondary | ICD-10-CM

## 2022-08-15 DIAGNOSIS — E669 Obesity, unspecified: Secondary | ICD-10-CM | POA: Diagnosis not present

## 2022-08-15 DIAGNOSIS — Z6829 Body mass index (BMI) 29.0-29.9, adult: Secondary | ICD-10-CM

## 2022-08-15 DIAGNOSIS — E7800 Pure hypercholesterolemia, unspecified: Secondary | ICD-10-CM

## 2022-08-15 NOTE — Progress Notes (Signed)
Office: [phone]  /  Fax: [phone]  WEIGHT SUMMARY AND BIOMETRICS  Medical Weight Loss Height: 5' 5"$  (1.651 m) Weight: 180 lb (81.6 kg) Temp: 98.4 F (36.9 C) Pulse Rate: 67 BP: 104/66 SpO2: 99 %  Body Fat %: 36.7 % Fat Mass (lbs): 66.4 lbs Muscle Mass (lbs): 108.6 lbs Total Body Water (lbs): 75 lbs Visceral Fat Rating : 8    HPI  Chief Complaint: OBESITY  Rebekah Wright is here to discuss her progress with her obesity treatment plan. She is on the the Category 2 Plan and states she is following her eating plan approximately 100 % of the time. She states she is exercising 90 minutes 6 times per week.   Interval History:  Since last office visit she is down 8 lb This gives her a net weight of 30 lb in 4 mos of medically supervised weight management  Denies hunger or cravings.  Would like to lose about 20 more pounds.  Total body weight loss over 4 months of medically supervised weight management 30 pounds which is a 14.2% total body weight loss  Body fat down 5.6 pounds in the past 4 weeks  Pharmacotherapy: none  PHYSICAL EXAM:  Blood pressure 104/66, pulse 67, temperature 98.4 F (36.9 C), height 5' 5"$  (1.651 m), weight 180 lb (81.6 kg), SpO2 99 %. Body mass index is 29.95 kg/m.  General: She is overweight, cooperative, alert, well developed, and in no acute distress. PSYCH: Has normal mood, affect and thought process.   HEENT: EOMI, sclerae are anicteric. Lungs: Normal breathing effort, no conversational dyspnea. Extremities: No edema.  Neurologic: No gross sensory or motor deficits. No tremors or fasciculations noted.    DIAGNOSTIC DATA REVIEWED:  BMET    Component Value Date/Time   NA 137 04/24/2022 0948   K 4.3 04/24/2022 0948   CL 96 04/24/2022 0948   CO2 26 04/24/2022 0948   GLUCOSE 109 (H) 04/24/2022 0948   GLUCOSE 120 (H) 08/03/2016 2113   BUN 14 04/24/2022 0948   CREATININE 0.85 04/24/2022 0948   CALCIUM 10.3 (H) 04/24/2022 0948    GFRNONAA >60 08/03/2016 2113   GFRAA >60 08/03/2016 2113   Lab Results  Component Value Date   HGBA1C 6.1 (H) 04/24/2022   Lab Results  Component Value Date   INSULIN 14.5 04/24/2022   Lab Results  Component Value Date   TSH 1.160 04/24/2022   CBC    Component Value Date/Time   WBC 7.4 04/24/2022 0948   WBC 14.0 (H) 08/03/2016 2113   RBC 5.24 04/24/2022 0948   RBC 5.14 (H) 08/03/2016 2113   HGB 16.5 (H) 04/24/2022 0948   HCT 48.2 (H) 04/24/2022 0948   PLT 309 04/24/2022 0948   MCV 92 04/24/2022 0948   MCH 31.5 04/24/2022 0948   MCH 32.3 08/03/2016 2113   MCHC 34.2 04/24/2022 0948   MCHC 34.9 08/03/2016 2113   RDW 12.4 04/24/2022 0948   Iron Studies No results found for: "IRON", "TIBC", "FERRITIN", "IRONPCTSAT" Lipid Panel     Component Value Date/Time   CHOL 269 (H) 04/24/2022 0948   TRIG 188 (H) 04/24/2022 0948   HDL 48 04/24/2022 0948   CHOLHDL 5.6 (H) 04/24/2022 0948   LDLCALC 186 (H) 04/24/2022 0948   Hepatic Function Panel     Component Value Date/Time   PROT 7.5 04/24/2022 0948   ALBUMIN 4.8 04/24/2022 0948   AST 18 04/24/2022 0948   ALT 24 04/24/2022 0948   ALKPHOS 95 04/24/2022 0948  BILITOT 0.3 04/24/2022 0948      Component Value Date/Time   TSH 1.160 04/24/2022 0948   Nutritional Lab Results  Component Value Date   VD25OH 40.0 04/24/2022     ASSESSMENT AND PLAN  TREATMENT PLAN FOR OBESITY:  Recommended Dietary Goals  Markita is currently in the action stage of change. As such, her goal is to continue weight management plan. She has agreed to the Category 2 Plan.  Behavioral Intervention  We discussed the following Behavioral Modification Strategies today: increasing lean protein intake, increasing vegetables, increasing fiber rich foods, increase water intake, work on meal planning and easy cooking plans, and think about ways to increase physical activity.  Additional resources provided today: NA  Recommended Physical Activity  Goals  Verlisa has been advised to work up to 150 minutes of moderate intensity aerobic activity a week and strengthening exercises 2-3 times per week for cardiovascular health, weight loss maintenance and preservation of muscle mass.   She has agreed to increase physical activity in their day and reduce sedentary time (increase NEAT).  and continue physical activity as is.    Pharmacotherapy We discussed various medication options to help Calyn with her weight loss efforts and we both agreed to none.  ASSOCIATED CONDITIONS ADDRESSED TODAY  Pre-diabetes Assessment & Plan: Lab Results  Component Value Date   HGBA1C 6.1 (H) 04/24/2022    Has done well with a low sugar diet, weight loss and increased exercise Never needed metformin.  Had diet controlled gestational diabetes  Recheck A1c today   Orders: -     Insulin, random -     Hemoglobin A1c  Vitamin D deficiency -     VITAMIN D 25 Hydroxy (Vit-D Deficiency, Fractures)  Hypercholesteremia -     Lipid panel  Essential hypertension Assessment & Plan: BP was running lower and she cut her Lisinopril/ HCTZ in half for the past 2 weeks Was feeling dizzy before she cut it in half Has + fam hx of HTN Has lost 14% TBW in 3 mos Did not take BP medicine x 2 days and BP is 104/66 today  Will d/c Lisinopril/ HCTZ   Other hyperlipidemia Assessment & Plan: Lab Results  Component Value Date   CHOL 269 (H) 04/24/2022   HDL 48 04/24/2022   LDLCALC 186 (H) 04/24/2022   TRIG 188 (H) 04/24/2022   CHOLHDL 5.6 (H) 04/24/2022     Has never needed cholesterol lowering medication Hopes to see improvements in lipids with weight loss and healthy lifestyle changes Continue a low sat fat/ low trans fat diet  Recheck FLP today   Generalized obesity  BMI 29.0-29.9,adult      No follow-ups on file.Marland Kitchen She was informed of the importance of frequent follow up visits to maximize her success with intensive lifestyle modifications for  her multiple health conditions.   ATTESTASTION STATEMENTS:  Reviewed by clinician on day of visit: allergies, medications, problem list, medical history, surgical history, family history, social history, and previous encounter notes.   Time spent on visit including pre-visit chart review and post-visit care and charting was 30 minutes.    Dell Ponto, DO

## 2022-08-15 NOTE — Assessment & Plan Note (Signed)
BP was running lower and she cut her Lisinopril/ HCTZ in half for the past 2 weeks Was feeling dizzy before she cut it in half Has + fam hx of HTN Has lost 14% TBW in 3 mos Did not take BP medicine x 2 days and BP is 104/66 today  Will d/c Lisinopril/ HCTZ

## 2022-08-15 NOTE — Assessment & Plan Note (Signed)
Lab Results  Component Value Date   CHOL 269 (H) 04/24/2022   HDL 48 04/24/2022   LDLCALC 186 (H) 04/24/2022   TRIG 188 (H) 04/24/2022   CHOLHDL 5.6 (H) 04/24/2022     Has never needed cholesterol lowering medication Hopes to see improvements in lipids with weight loss and healthy lifestyle changes Continue a low sat fat/ low trans fat diet  Recheck FLP today

## 2022-08-15 NOTE — Assessment & Plan Note (Signed)
Lab Results  Component Value Date   HGBA1C 6.1 (H) 04/24/2022    Has done well with a low sugar diet, weight loss and increased exercise Never needed metformin.  Had diet controlled gestational diabetes  Recheck A1c today

## 2022-08-16 LAB — LIPID PANEL
Chol/HDL Ratio: 3.9 ratio (ref 0.0–4.4)
Cholesterol, Total: 189 mg/dL (ref 100–199)
HDL: 48 mg/dL (ref >39)
LDL Chol Calc (NIH): 125 mg/dL — ABNORMAL HIGH (ref 0–99)
Triglycerides: 87 mg/dL (ref 0–149)
VLDL Cholesterol Cal: 16 mg/dL (ref 5–40)

## 2022-08-16 LAB — HEMOGLOBIN A1C
Est. average glucose Bld gHb Est-mCnc: 120 mg/dL
Hgb A1c MFr Bld: 5.8 % — ABNORMAL HIGH (ref 4.8–5.6)

## 2022-08-16 LAB — INSULIN, RANDOM: INSULIN: 10 u[IU]/mL (ref 2.6–24.9)

## 2022-08-16 LAB — VITAMIN D 25 HYDROXY (VIT D DEFICIENCY, FRACTURES): Vit D, 25-Hydroxy: 75.7 ng/mL (ref 30.0–100.0)

## 2022-09-11 ENCOUNTER — Ambulatory Visit (INDEPENDENT_AMBULATORY_CARE_PROVIDER_SITE_OTHER): Payer: 59 | Admitting: Family Medicine

## 2022-09-11 ENCOUNTER — Encounter (INDEPENDENT_AMBULATORY_CARE_PROVIDER_SITE_OTHER): Payer: Self-pay | Admitting: Family Medicine

## 2022-09-11 VITALS — BP 127/77 | HR 80 | Temp 98.6°F | Ht 65.0 in | Wt 177.0 lb

## 2022-09-11 DIAGNOSIS — E7800 Pure hypercholesterolemia, unspecified: Secondary | ICD-10-CM | POA: Diagnosis not present

## 2022-09-11 DIAGNOSIS — I10 Essential (primary) hypertension: Secondary | ICD-10-CM

## 2022-09-11 DIAGNOSIS — Z6829 Body mass index (BMI) 29.0-29.9, adult: Secondary | ICD-10-CM

## 2022-09-11 DIAGNOSIS — R7303 Prediabetes: Secondary | ICD-10-CM | POA: Diagnosis not present

## 2022-09-11 DIAGNOSIS — E669 Obesity, unspecified: Secondary | ICD-10-CM | POA: Insufficient documentation

## 2022-09-11 DIAGNOSIS — E559 Vitamin D deficiency, unspecified: Secondary | ICD-10-CM

## 2022-09-11 MED ORDER — HYDROCHLOROTHIAZIDE 12.5 MG PO CAPS: 12.5000 mg | ORAL_CAPSULE | Freq: Every day | ORAL | 0 refills | Status: DC

## 2022-09-11 NOTE — Progress Notes (Signed)
Office: [phone]  /  Fax: [place]  Starting Date: 04/24/22  Starting Weight: 210lb   Weight Lost Since Last Visit: 3lb   Vitals Temp: 98.6 F (37 C) BP: 127/77 Pulse Rate: 80 SpO2: 100 %   Body Composition  Body Fat %: 36.2 % Fat Mass (lbs): 64.2 lbs Muscle Mass (lbs): 107.2 lbs Total Body Water (lbs): 73.8 lbs Visceral Fat Rating : 7   HPI  Chief Complaint: OBESITY  Rebekah Wright is here to discuss her progress with her obesity treatment plan. She is on the the Category 2 Plan and states she is following her eating plan approximately 100 % of the time. She states she is exercising 60 minutes 1-2 times per week.   Interval History:  Since last office visit she down 3 lb This gives her a net weight loss of 33 lb in 5 mos of medically supervised weight management She did have a cold between visits and had menstrual food cravings and fluid retention BP started to run higher off Lisinopril / HCTZ She is getting 10,000-15,000 steps daily She has added in some home resistance training exercise  Pharmacotherapy: None  PHYSICAL EXAM:  Blood pressure 127/77, pulse 80, temperature 98.6 F (37 C), height '5\' 5"'$  (1.651 m), weight 177 lb (80.3 kg), SpO2 100 %. Body mass index is 29.45 kg/m.  General: She is normal weight, cooperative, alert, well developed, and in no acute distress. PSYCH: Has normal mood, affect and thought process.   Lungs: Normal breathing effort, no conversational dyspnea.   ASSESSMENT AND PLAN  TREATMENT PLAN FOR OBESITY:  Recommended Dietary Goals  Rebekah Wright is currently in the action stage of change. As such, her goal is to continue weight management plan. She has agreed to the Category 2 Plan.  Behavioral Intervention  We discussed the following Behavioral Modification Strategies today: increasing lean protein intake, increasing vegetables, increasing fiber rich foods, increasing water intake, and work on  meal planning and easy cooking plans.  Additional resources provided today: NA  Recommended Physical Activity Goals  Rebekah Wright has been advised to work up to 150 minutes of moderate intensity aerobic activity a week and strengthening exercises 2-3 times per week for cardiovascular health, weight loss maintenance and preservation of muscle mass.   She has agreed to continue physical activity as is.   Pharmacotherapy changes for the treatment of obesity: None  ASSOCIATED CONDITIONS ADDRESSED TODAY  Pre-diabetes Assessment & Plan: Improving Reviewed labs from last visit.  A1c has improved from 6.1-5.8 in 5 months  Lab Results  Component Value Date   HGBA1C 5.8 (H) 08/15/2022   Continue active plan for weight reduction, reducing intake of refined carbohydrates and added sugar while focusing on lean protein and fiber with meals   Vitamin D deficiency Assessment & Plan: Last vitamin D Lab Results  Component Value Date   VD25OH 75.7 08/15/2022   Reviewed labs from last visit.  Her vitamin D level did improve greatly from 48-75.7.  She has discontinued prescription vitamin D 50,000 IU once weekly and switched over to over-the-counter vitamin D3 2000 IU once daily.  Plan to recheck level in 6 months.   Hypercholesteremia Assessment & Plan: Improving Reviewed labs from last visit  LDL has improved from 186-125  Lab Results  Component Value Date   CHOL 189 08/15/2022   HDL 48 08/15/2022   LDLCALC 125 (H) 08/15/2022   TRIG 87 08/15/2022   CHOLHDL 3.9 08/15/2022   Continue a low  saturated fat/low trans fat diet    Essential hypertension Assessment & Plan: Patient reports episodes of elevated blood pressure readings and fluid retention off lisinopril/HCTZ 20/12.5 mg once daily.  She denies headache or chest pain.  Will start HCTZ 12.5 mg capsule once daily Limit intake of sodium Hydrate well with water Continue active plan for weight reduction  Orders: -      hydroCHLOROthiazide; Take 1 capsule (12.5 mg total) by mouth daily.  Dispense: 90 capsule; Refill: 0  Generalized obesity Assessment & Plan: Improving Patient has a net weight loss of 33 pounds in 5 months of medically supervised weight management.  This is a 15.7% total body weight loss.  She is doing good on her prescribed meal plan.  We discussed increasing her daily calorie goal of 1500/day which should include about 110 g of protein daily. She has lost some muscle mass.  Will begin resistance training exercise 15 minutes 3 times a week.   BMI 29.0-29.9,adult      She was informed of the importance of frequent follow up visits to maximize her success with intensive lifestyle modifications for her multiple health conditions.   ATTESTASTION STATEMENTS:  Reviewed by clinician on day of visit: allergies, medications, problem list, medical history, surgical history, family history, social history, and previous encounter notes pertinent to obesity diagnosis.   I have personally spent 30 minutes total time today in preparation, patient care, nutritional counseling and documentation for this visit, including the following: review of clinical lab tests; review of medical tests/procedures/services.      Dell Ponto, DO DABFM, DABOM [facility] [address] [place], [place] [address] [phone]

## 2022-09-11 NOTE — Assessment & Plan Note (Signed)
Patient reports episodes of elevated blood pressure readings and fluid retention off lisinopril/HCTZ 20/12.5 mg once daily.  She denies headache or chest pain.  Will start HCTZ 12.5 mg capsule once daily Limit intake of sodium Hydrate well with water Continue active plan for weight reduction

## 2022-09-11 NOTE — Assessment & Plan Note (Signed)
Improving Patient has a net weight loss of 33 pounds in 5 months of medically supervised weight management.  This is a 15.7% total body weight loss.  She is doing good on her prescribed meal plan.  We discussed increasing her daily calorie goal of 1500/day which should include about 110 g of protein daily. She has lost some muscle mass.  Will begin resistance training exercise 15 minutes 3 times a week.

## 2022-09-11 NOTE — Assessment & Plan Note (Signed)
Last vitamin D Lab Results  Component Value Date   VD25OH 75.7 08/15/2022   Reviewed labs from last visit.  Her vitamin D level did improve greatly from 48-75.7.  She has discontinued prescription vitamin D 50,000 IU once weekly and switched over to over-the-counter vitamin D3 2000 IU once daily.  Plan to recheck level in 6 months.

## 2022-09-11 NOTE — Assessment & Plan Note (Signed)
Improving Reviewed labs from last visit  LDL has improved from 186-125  Lab Results  Component Value Date   CHOL 189 08/15/2022   HDL 48 08/15/2022   LDLCALC 125 (H) 08/15/2022   TRIG 87 08/15/2022   CHOLHDL 3.9 08/15/2022   Continue a low saturated fat/low trans fat diet

## 2022-09-11 NOTE — Assessment & Plan Note (Signed)
Improving Reviewed labs from last visit.  A1c has improved from 6.1-5.8 in 5 months  Lab Results  Component Value Date   HGBA1C 5.8 (H) 08/15/2022   Continue active plan for weight reduction, reducing intake of refined carbohydrates and added sugar while focusing on lean protein and fiber with meals

## 2022-10-10 ENCOUNTER — Encounter (INDEPENDENT_AMBULATORY_CARE_PROVIDER_SITE_OTHER): Payer: Self-pay | Admitting: Family Medicine

## 2022-10-10 ENCOUNTER — Ambulatory Visit (INDEPENDENT_AMBULATORY_CARE_PROVIDER_SITE_OTHER): Payer: 59 | Admitting: Family Medicine

## 2022-10-10 VITALS — BP 128/84 | HR 60 | Temp 98.2°F | Ht 65.0 in | Wt 177.0 lb

## 2022-10-10 DIAGNOSIS — E559 Vitamin D deficiency, unspecified: Secondary | ICD-10-CM

## 2022-10-10 DIAGNOSIS — E669 Obesity, unspecified: Secondary | ICD-10-CM

## 2022-10-10 DIAGNOSIS — I10 Essential (primary) hypertension: Secondary | ICD-10-CM | POA: Diagnosis not present

## 2022-10-10 DIAGNOSIS — Z6829 Body mass index (BMI) 29.0-29.9, adult: Secondary | ICD-10-CM

## 2022-10-10 DIAGNOSIS — R7303 Prediabetes: Secondary | ICD-10-CM

## 2022-10-10 NOTE — Assessment & Plan Note (Addendum)
Lab Results  Component Value Date   HGBA1C 5.8 (H) 08/15/2022   Improving with dietary changes, exercise and weight loss A1c has improved from 6.1 to 5.8 She is not taking metformin or a  GLP-1 receptor agonist  Continue to work on a low sugar/ lower starch diet, regular exercise 30+ min 4-5 days/ wk and a BMI <27.  Continue dietary log.

## 2022-10-10 NOTE — Assessment & Plan Note (Signed)
BP is well controlled on HCTZ 12.5 mg daily.  Denies adverse side effects.  Actively working on weight loss.    Continue to limit intake of high sodium foods.  Continue HCTZ. Update CMP with next labs.

## 2022-10-10 NOTE — Progress Notes (Signed)
Office: [phone]  /  Fax: [phone]  WEIGHT SUMMARY AND BIOMETRICS  Starting Date: 04/24/22  Starting Weight: 210lb   Weight Lost Since Last Visit: 0   Vitals Temp: 98.2 F (36.8 C) BP: 128/84 Pulse Rate: 60 SpO2: 100 %   Body Composition  Body Fat %: 36.3 % Fat Mass (lbs): 64.6 lbs Muscle Mass (lbs): 107.4 lbs Total Body Water (lbs): 76.8 lbs Visceral Fat Rating : 7     HPI  Chief Complaint: OBESITY  Rebekah Wright is here to discuss her progress with her obesity treatment plan. She is on the the Category 2 Plan and states she is following her eating plan approximately 100 % of the time. She states she is exercising 30-60 minutes 1 times per week.   Interval History:  Since last office visit she is down 0 lb She has a net weight loss of 33 lb in 6 mos She is maintaining her muscle mass and is up 3 lb of water weight (on menses) She is doing well increasing from Cat 2--> 1500 cal/ day but she is getting only 1400 kcal / day She is getting in 10,000 steps most day She is doing weight training 0-1 x a week She is consistent about getting in her protein- getting > 100 g of protein daily On days off work, she walks 3 miles on a track  Plan: Increase weight training to 2 days/ wk Ensure hitting target of 1500 cal/ day to include ~100 g of protein daily and no not skip meals  Pharmacotherapy: none  PHYSICAL EXAM:  Blood pressure 128/84, pulse 60, temperature 98.2 F (36.8 C), height 5\' 5"  (1.651 m), weight 177 lb (80.3 kg), SpO2 100 %. Body mass index is 29.45 kg/m.  General: She is healthy appearing, cooperative, alert, well developed, and in no acute distress. PSYCH: Has normal mood, affect and thought process.   Lungs: Normal breathing effort, no conversational dyspnea.   ASSESSMENT AND PLAN  TREATMENT PLAN FOR OBESITY:  Recommended Dietary Goals  Rebekah Wright is currently in the action stage of change. As such, her goal is to continue weight management  plan. She has agreed to logging intake ~1500 kcal /Day on My[employer]itnessPal.  Behavioral Intervention  We discussed the following Behavioral Modification Strategies today: increasing lean protein intake, decreasing simple carbohydrates , increasing vegetables, increasing lower glycemic fruits, increasing fiber rich foods, avoiding skipping meals, increasing water intake, work on meal planning and preparation, work on managing stress, creating time for self-care and relaxation measures, and planning for success.  Additional resources provided today: NA  Recommended Physical Activity Goals  Rebekah Wright has been advised to work up to 150 minutes of moderate intensity aerobic activity a week and strengthening exercises 2-3 times per week for cardiovascular health, weight loss maintenance and preservation of muscle mass.   She has agreed to [name] strengthening exercises with a goal of 2-3 sessions a week   Pharmacotherapy changes for the treatment of obesity: none  ASSOCIATED CONDITIONS ADDRESSED TODAY  Pre-diabetes Assessment & Plan: Lab Results  Component Value Date   HGBA1C 5.8 (H) 08/15/2022   Improving with dietary changes, exercise and weight loss A1c has improved from 6.1 to 5.8 She is not taking metformin or a  GLP-1 receptor agonist  Continue to work on a low sugar/ lower starch diet, regular exercise 30+ min 4-5 days/ wk and a BMI <27.  Continue dietary log.      Vitamin D deficiency Assessment & Plan: Last vitamin D Lab  Results  Component Value Date   VD25OH 75.7 08/15/2022   Doing well without RX vitamin D.  Taking an OTC vitamin D 2,000 IU once daily supplement.  Reports good energy levels.     Essential hypertension Assessment & Plan: BP is well controlled on HCTZ 12.5 mg daily.  Denies adverse side effects.  Actively working on weight loss.    Continue to limit intake of high sodium foods.  Continue HCTZ. Update CMP with next labs.   Generalized obesity  BMI  29.0-29.9,adult      She was informed of the importance of frequent follow up visits to maximize her success with intensive lifestyle modifications for her multiple health conditions.   ATTESTASTION STATEMENTS:  Reviewed by clinician on day of visit: allergies, medications, problem list, medical history, surgical history, family history, social history, and previous encounter notes pertinent to obesity diagnosis.   I have personally spent 30 minutes total time today in preparation, patient care, nutritional counseling and documentation for this visit, including the following: review of clinical lab tests; review of medical tests/procedures/services.      Glennis Brink, DO DABFM, DABOM [facility] [address] [place], [place] [address] [phone]

## 2022-10-10 NOTE — Assessment & Plan Note (Signed)
Last vitamin D Lab Results  Component Value Date   VD25OH 75.7 08/15/2022   Doing well without RX vitamin D.  Taking an OTC vitamin D 2,000 IU once daily supplement.  Reports good energy levels.

## 2022-11-07 ENCOUNTER — Ambulatory Visit (INDEPENDENT_AMBULATORY_CARE_PROVIDER_SITE_OTHER): Payer: 59 | Admitting: Family Medicine

## 2022-11-07 ENCOUNTER — Encounter (INDEPENDENT_AMBULATORY_CARE_PROVIDER_SITE_OTHER): Payer: Self-pay | Admitting: Family Medicine

## 2022-11-07 VITALS — BP 130/74 | HR 60 | Temp 98.1°F | Ht 65.0 in | Wt 175.0 lb

## 2022-11-07 DIAGNOSIS — Z6829 Body mass index (BMI) 29.0-29.9, adult: Secondary | ICD-10-CM | POA: Diagnosis not present

## 2022-11-07 DIAGNOSIS — E669 Obesity, unspecified: Secondary | ICD-10-CM | POA: Diagnosis not present

## 2022-11-07 DIAGNOSIS — E559 Vitamin D deficiency, unspecified: Secondary | ICD-10-CM | POA: Diagnosis not present

## 2022-11-07 DIAGNOSIS — R7303 Prediabetes: Secondary | ICD-10-CM | POA: Diagnosis not present

## 2022-11-07 NOTE — Assessment & Plan Note (Signed)
Last vitamin D Lab Results  Component Value Date   VD25OH 75.7 08/15/2022   She is taking OTC Vitamin D 2,0000 IU daily since last set of labs showed a big improvement in vitamin D levels Energy level is fair.  Continue OTC Vitamin D 2,000 IU daily. Repeat level in the Fall

## 2022-11-07 NOTE — Assessment & Plan Note (Signed)
Reviewed her bioimpedence results and overall progress Her weight loss is slowing down now that her BMI is <30 She is logging her daily intake but was undereating while sick and exercise has reduced She is consistent with tracking daily steps She has added in weight training 1 x a week  Continue to log aiming for 1500 kcal/ day.  This should include > 100 g of protein daily (doing well with this). Increase weight training to 2 x a week and consider use of a Systems analyst

## 2022-11-07 NOTE — Assessment & Plan Note (Signed)
Lab Results  Component Value Date   HGBA1C 5.8 (H) 08/15/2022   She is doing well with a low sugar/ low starch diet, weight loss and regular exercise. She has declined use of metformin.  Recheck A1c next month Continue low sugar/ low starch diet with a focus on lean protein and fiber with meals Aim for 10,000 steps/ day and weight training 2 days/ wk.

## 2022-11-07 NOTE — Progress Notes (Signed)
Office: [phone]  /  Fax: [phone]  WEIGHT SUMMARY AND BIOMETRICS  Starting Date: 04/24/22  Starting Weight: 210lb   Weight Lost Since Last Visit: 2lb   Vitals Temp: 98.1 F (36.7 C) BP: 130/74 Pulse Rate: 60 SpO2: 98 %   Body Composition  Body Fat %: 35.7 % Fat Mass (lbs): 62.6 lbs Muscle Mass (lbs): 107 lbs Total Body Water (lbs): 74.6 lbs Visceral Fat Rating : 7    HPI  Chief Complaint: OBESITY  Rebekah Wright is here to discuss her progress with her obesity treatment plan. She is on the the Category 2 Plan and states she is following her eating plan approximately 100 % of the time. She states she is exercising 30-60 minutes 5-7 times per week.   Interval History:  Since last office visit she is down 2 lb Her net weight loss is 35 lb in the past 7 mos She did have a GI bug since her last visit She was barely eating for a week and is back on track She has added in weight training at the gym.  She is walking 3 miles 3-4 x a week She is tracking her steps getting 10,000-12,000 steps/ days She did increase her calories to 1500 and is getting 100+ g of protein daily She is getting in fruits and veggies  Increase circuit weight training to 2 x a week.  Consider seeing a personal trainer  Pharmacotherapy: none  PHYSICAL EXAM:  Blood pressure 130/74, pulse 60, temperature 98.1 F (36.7 C), height 5\' 5"  (1.651 m), weight 175 lb (79.4 kg), SpO2 98 %. Body mass index is 29.12 kg/m.  General: She is healthy appearing,  cooperative, alert, well developed, and in no acute distress. PSYCH: Has normal mood, affect and thought process.   Lungs: Normal breathing effort, no conversational dyspnea.   ASSESSMENT AND PLAN  TREATMENT PLAN FOR OBESITY:  Recommended Dietary Goals  [name] is currently in the action stage of change. As such, her goal is to continue weight management plan. She has agreed to keeping a food journal and adhering to recommended goals of 1500  calories and 100+ g of  protein.  Behavioral Intervention  We discussed the following Behavioral Modification Strategies today: increasing lean protein intake, decreasing simple carbohydrates , increasing vegetables, increasing lower glycemic fruits, avoiding skipping meals, increasing water intake, keeping healthy foods at home, continue to practice mindfulness when eating, and planning for success.  Additional resources provided today: NA  Recommended Physical Activity Goals  [name] has been advised to work up to 150 minutes of moderate intensity aerobic activity a week and strengthening exercises 2-3 times per week for cardiovascular health, weight loss maintenance and preservation of muscle mass.   She has agreed to Increase the intensity, frequency or duration of strengthening exercises   Pharmacotherapy changes for the treatment of obesity: none  ASSOCIATED CONDITIONS ADDRESSED TODAY  Pre-diabetes Assessment & Plan: Lab Results  Component Value Date   HGBA1C 5.8 (H) 08/15/2022   She is doing well with a low sugar/ low starch diet, weight loss and regular exercise. She has declined use of metformin.  Recheck A1c next month Continue low sugar/ low starch diet with a focus on lean protein and fiber with meals Aim for 10,000 steps/ day and weight training 2 days/ wk.   Generalized obesity with initial BMI 34 Assessment & Plan: Reviewed her bioimpedence results and overall progress Her weight loss is slowing down now that her BMI is <30 She is logging  her daily intake but was undereating while sick and exercise has reduced She is consistent with tracking daily steps She has added in weight training 1 x a week  Continue to log aiming for 1500 kcal/ day.  This should include > 100 g of protein daily (doing well with this). Increase weight training to 2 x a week and consider use of a personal trainer     BMI 29.0-29.9,adult  Vitamin D deficiency Assessment & Plan: Last  vitamin D Lab Results  Component Value Date   VD25OH 75.7 08/15/2022   She is taking OTC Vitamin D 2,0000 IU daily since last set of labs showed a big improvement in vitamin D levels Energy level is fair.  Continue OTC Vitamin D 2,000 IU daily. Repeat level in the Fall       She was informed of the importance of frequent follow up visits to maximize her success with intensive lifestyle modifications for her multiple health conditions.   ATTESTASTION STATEMENTS:  Reviewed by clinician on day of visit: allergies, medications, problem list, medical history, surgical history, family history, social history, and previous encounter notes pertinent to obesity diagnosis.   I have personally spent 30 minutes total time today in preparation, patient care, nutritional counseling and documentation for this visit, including the following: review of clinical lab tests; review of medical tests/procedures/services.      Glennis Brink, DO DABFM, DABOM [facility] [address] [place], [place] [address] [phone]

## 2022-11-28 ENCOUNTER — Ambulatory Visit (INDEPENDENT_AMBULATORY_CARE_PROVIDER_SITE_OTHER): Payer: 59 | Admitting: Family Medicine

## 2022-11-28 ENCOUNTER — Encounter (INDEPENDENT_AMBULATORY_CARE_PROVIDER_SITE_OTHER): Payer: Self-pay | Admitting: Family Medicine

## 2022-11-28 VITALS — BP 112/75 | HR 68 | Temp 98.2°F | Ht 65.0 in | Wt 176.0 lb

## 2022-11-28 DIAGNOSIS — Z6829 Body mass index (BMI) 29.0-29.9, adult: Secondary | ICD-10-CM

## 2022-11-28 DIAGNOSIS — I10 Essential (primary) hypertension: Secondary | ICD-10-CM

## 2022-11-28 DIAGNOSIS — E669 Obesity, unspecified: Secondary | ICD-10-CM | POA: Diagnosis not present

## 2022-11-28 DIAGNOSIS — R7303 Prediabetes: Secondary | ICD-10-CM

## 2022-11-28 DIAGNOSIS — E7800 Pure hypercholesterolemia, unspecified: Secondary | ICD-10-CM

## 2022-11-28 NOTE — Assessment & Plan Note (Signed)
Blood pressure is well-controlled currently on HCTZ 12.5 mg once daily. She has done well with weight reduction, down 34 pounds over the past 7 months of medically supervised weight management.  This is a 16% total body weight loss  Continue healthy dietary changes, regular exercise.  Continue HCTZ 12.5 mg once daily.  Recheck CMP in 1 to 2 months consider discontinuing HCTZ next visit.

## 2022-11-28 NOTE — Assessment & Plan Note (Signed)
Reviewed patient's overall progress.  She is upset about her slow down and weight loss but her BMI is now 29 and she is gaining muscle mass and losing body fat.  We discussed incorporating more overall lifestyle changes incorporating some of the foods that she enjoys eating just in smaller frequency and portion sizes.  She plans to get back into regular exercise once her back pain improves.  Offered a referral to sports medicine which she declined. Continue focus on dietary logging with a goal of 1500 cal/day which should include at least 100 g of protein daily.  Repeat IC test in July.

## 2022-11-28 NOTE — Assessment & Plan Note (Signed)
Lab Results  Component Value Date   HGBA1C 5.8 (H) 08/15/2022   She has actually been working on reducing her intake of added sugar and refined carbohydrates though has had an increase since her last visit due to acute back pain.  We discussed keeping junk food triggers out of the house and ensuring she is getting enough adequate protein and fiber with meals.  We also discussed incorporating some of the foods that she likes and healthier versions and portion control.  She is doing great with regular exercise incorporating both cardio and resistance training for at least 150 minutes/week.  She has declined use of metformin.  Recheck A1c, fasting insulin in 2 months

## 2022-11-28 NOTE — Assessment & Plan Note (Signed)
She is currently not on any medication for lipid lowering.  She has actually been working on healthy dietary change and weight reduction.  She is down 16% total body weight in the past 7 months of medically supervised weight management on a low saturated fat diet.  Plan to recheck fasting lipid panel in July  The 10-year ASCVD risk score (Arnett DK, et al., 2019) is: 3.1%   Values used to calculate the score:     Age: 47 years     Sex: Female     Is Non-Hispanic African American: No     Diabetic: No     Tobacco smoker: Yes     Systolic Blood Pressure: 112 mmHg     Is BP treated: Yes     HDL Cholesterol: 48 mg/dL     Total Cholesterol: 189 mg/dL

## 2022-11-28 NOTE — Progress Notes (Signed)
Office: [phone]  /  Fax: [phone]  WEIGHT SUMMARY AND BIOMETRICS  Starting Date: 04/24/22  Starting Weight: 210 lb   Weight Lost Since Last Visit: 0   Vitals Temp: 98.2 F (36.8 C) BP: 112/75 Pulse Rate: 68 SpO2: 98 %   Body Composition  Body Fat %: 35.1 % Fat Mass (lbs): 61.8 lbs Muscle Mass (lbs): 108.6 lbs Total Body Water (lbs): 74.8 lbs Visceral Fat Rating : 7     HPI  Chief Complaint: OBESITY  Macel is here to discuss her progress with her obesity treatment plan. She is on the the Category 2 Plan and states she is following her eating plan approximately 90 % of the time. She states she is exercising walking 15,000 steps 4 days per week, gym, weights 45 minutes 2 times per week.   Interval History:  Since last office visit she is up 1 lb She gained 1.6 lb of muscle mass and lost 0.8 lb of body fat She was walking more and lifting weighs but had some low back pain  Denies radiation of pain, tingling numbness or weakness She plans to get back into walking and weights once her back feels better She admits to eating more M&Ms and wheat thins She is still logging her daily intake of calories and protein.  She is hitting 100 to 150 g of protein intake daily  Pharmacotherapy: none  PHYSICAL EXAM:  Blood pressure 112/75, pulse 68, temperature 98.2 F (36.8 C), height 5\' 5"  (1.651 m), weight 176 lb (79.8 kg), SpO2 98 %. Body mass index is 29.29 kg/m.  General: She is overweight, cooperative, alert, well developed, and in no acute distress. PSYCH: Has normal mood, affect and thought process.   Lungs: Normal breathing effort, no conversational dyspnea.   ASSESSMENT AND PLAN  TREATMENT PLAN FOR OBESITY:  Recommended Dietary Goals  Kimiah is currently in the action stage of change. As such, her goal is to continue weight management plan. She has agreed to keeping a food journal and adhering to recommended goals of 1500 calories and 100 g or more  of protein.  Behavioral Intervention  We discussed the following Behavioral Modification Strategies today: increasing lean protein intake, decreasing simple carbohydrates , increasing vegetables, increasing lower glycemic fruits, increasing water intake, work on meal planning and preparation, work on Counselling psychologist calories using tracking application, keeping healthy foods at home, avoiding temptations and identifying enticing environmental cues, continue to practice mindfulness when eating, and planning for success.  Additional resources provided today: NA  Recommended Physical Activity Goals  Oradell has been advised to work up to 150 minutes of moderate intensity aerobic activity a week and strengthening exercises 2-3 times per week for cardiovascular health, weight loss maintenance and preservation of muscle mass.   She has agreed to Increase the intensity, frequency or duration of strengthening exercises   Pharmacotherapy changes for the treatment of obesity: None  ASSOCIATED CONDITIONS ADDRESSED TODAY  Pre-diabetes Assessment & Plan: Lab Results  Component Value Date   HGBA1C 5.8 (H) 08/15/2022   She has actually been working on reducing her intake of added sugar and refined carbohydrates though has had an increase since her last visit due to acute back pain.  We discussed keeping junk food triggers out of the house and ensuring she is getting enough adequate protein and fiber with meals.  We also discussed incorporating some of the foods that she likes and healthier versions and portion control.  She is doing great with  regular exercise incorporating both cardio and resistance training for at least 150 minutes/week.  She has declined use of metformin.  Recheck A1c, fasting insulin in 2 months   Generalized obesity with initial BMI 34 Assessment & Plan: Reviewed patient's overall progress.  She is upset about her slow down and weight loss but her BMI is now 29 and she is  gaining muscle mass and losing body fat.  We discussed incorporating more overall lifestyle changes incorporating some of the foods that she enjoys eating just in smaller frequency and portion sizes.  She plans to get back into regular exercise once her back pain improves.  Offered a referral to sports medicine which she declined. Continue focus on dietary logging with a goal of 1500 cal/day which should include at least 100 g of protein daily.  Repeat IC test in July.   BMI 29.0-29.9,adult  Hypercholesteremia Assessment & Plan: She is currently not on any medication for lipid lowering.  She has actually been working on healthy dietary change and weight reduction.  She is down 16% total body weight in the past 7 months of medically supervised weight management on a low saturated fat diet.  Plan to recheck fasting lipid panel in July  The 10-year ASCVD risk score (Arnett DK, et al., 2019) is: 3.1%   Values used to calculate the score:     Age: 12 years     Sex: Female     Is Non-Hispanic African American: No     Diabetic: No     Tobacco smoker: Yes     Systolic Blood Pressure: 112 mmHg     Is BP treated: Yes     HDL Cholesterol: 48 mg/dL     Total Cholesterol: 189 mg/dL     Essential hypertension Assessment & Plan: Blood pressure is well-controlled currently on HCTZ 12.5 mg once daily. She has done well with weight reduction, down 34 pounds over the past 7 months of medically supervised weight management.  This is a 16% total body weight loss  Continue healthy dietary changes, regular exercise.  Continue HCTZ 12.5 mg once daily.  Recheck CMP in 1 to 2 months consider discontinuing HCTZ next visit.       She was informed of the importance of frequent follow up visits to maximize her success with intensive lifestyle modifications for her multiple health conditions.   ATTESTASTION STATEMENTS:  Reviewed by clinician on day of visit: allergies, medications, problem list, medical  history, surgical history, family history, social history, and previous encounter notes pertinent to obesity diagnosis.   I have personally spent 30 minutes total time today in preparation, patient care, nutritional counseling and documentation for this visit, including the following: review of clinical lab tests; review of medical tests/procedures/services.      Glennis Brink, DO DABFM, DABOM [facility] [address] [place], [place] [address] [phone]

## 2022-12-26 ENCOUNTER — Ambulatory Visit (INDEPENDENT_AMBULATORY_CARE_PROVIDER_SITE_OTHER): Payer: 59 | Admitting: Family Medicine

## 2022-12-26 ENCOUNTER — Other Ambulatory Visit: Payer: Self-pay

## 2022-12-26 ENCOUNTER — Encounter (INDEPENDENT_AMBULATORY_CARE_PROVIDER_SITE_OTHER): Payer: Self-pay | Admitting: Family Medicine

## 2022-12-26 VITALS — BP 124/81 | HR 59 | Temp 97.8°F | Ht 65.0 in | Wt 175.0 lb

## 2022-12-26 DIAGNOSIS — I10 Essential (primary) hypertension: Secondary | ICD-10-CM

## 2022-12-26 DIAGNOSIS — R7303 Prediabetes: Secondary | ICD-10-CM

## 2022-12-26 DIAGNOSIS — E559 Vitamin D deficiency, unspecified: Secondary | ICD-10-CM

## 2022-12-26 DIAGNOSIS — E669 Obesity, unspecified: Secondary | ICD-10-CM

## 2022-12-26 DIAGNOSIS — E7800 Pure hypercholesterolemia, unspecified: Secondary | ICD-10-CM | POA: Diagnosis not present

## 2022-12-26 DIAGNOSIS — Z6829 Body mass index (BMI) 29.0-29.9, adult: Secondary | ICD-10-CM

## 2022-12-26 MED ORDER — HYDROCHLOROTHIAZIDE 12.5 MG PO CAPS: 12.5000 mg | ORAL_CAPSULE | Freq: Every day | ORAL | 0 refills | Status: DC

## 2022-12-26 NOTE — Assessment & Plan Note (Signed)
Lab Results  Component Value Date   CHOL 189 08/15/2022   HDL 48 08/15/2022   LDLCALC 125 (H) 08/15/2022   TRIG 87 08/15/2022   CHOLHDL 3.9 08/15/2022   She has seen LDL reduction over the past few months.  She has lost 16.6% total body weight loss in the past 8 months of medically supervised weight management.  She has abided by a low saturated fat diet.  Recheck fasting lipid panel in the next 2 months

## 2022-12-26 NOTE — Telephone Encounter (Signed)
From: Julieann L. Likes To: Office of Glennis Brink, DO Sent: 12/24/2022 3:09 PM EDT Subject: Medication Renewal Request  Refills have been requested for the following medications:   hydrochlorothiazide (MICROZIDE) 12.5 MG capsule Clydie Braun E Bowen]  Preferred pharmacy: [facility] [place] - [id number] [address] Delivery method: [employer]

## 2022-12-26 NOTE — Assessment & Plan Note (Signed)
Blood pressure well-controlled on HCTZ 12.5 mg once daily. Denies adverse side effects Actively working on weight loss Avoid high sodium foods  Continue active plan for weight reduction, healthy diet and exercise changes.  Continue HCTZ 12.5 mg daily Update chemistry panel next visit

## 2022-12-26 NOTE — Assessment & Plan Note (Signed)
Last vitamin D Lab Results  Component Value Date   VD25OH 75.7 08/15/2022   She has changed her vitamin D from 50,000 IU once weekly to over-the-counter vitamin D 2000 IU once daily.  Her vitamin D level had improved greatly and her energy level has improved.  Continue vitamin D 2000 IU once daily.  Recheck vitamin D level with next labs.

## 2022-12-26 NOTE — Assessment & Plan Note (Signed)
Lab Results  Component Value Date   HGBA1C 5.8 (H) 08/15/2022   She is working on a low sugar/low starch diet rich in lean protein and fiber with meals.  She had been tracking daily steps and added in weight training but this has reduced over the past month due to her work schedule.  She does plan on ramping back up physical activity once her work schedule allows.  She is currently not on metformin.  Continue working on healthy lifestyle changes.  Will aim for a BMI under 27. Consider use of metformin.  Recheck A1c in the next 1 to 2 months

## 2022-12-26 NOTE — Progress Notes (Signed)
Office: [phone]  /  Fax: [phone]  WEIGHT SUMMARY AND BIOMETRICS  Starting Date: 04/24/22  Starting Weight: 210lb   Weight Lost Since Last Visit: 1lb   Vitals Temp: 97.8 F (36.6 C) BP: 124/81 Pulse Rate: (!) 59 SpO2: 99 %   Body Composition  Body Fat %: 36.9 % Fat Mass (lbs): 64.8 lbs Muscle Mass (lbs): 105.4 lbs Total Body Water (lbs): 77.2 lbs Visceral Fat Rating : 7     HPI  Chief Complaint: OBESITY  Rebekah Wright is here to discuss her progress with her obesity treatment plan. She is on the the Category 2 Plan and states she is following her eating plan approximately 80 % of the time. She states she is walking 10,000 steps a day.    Interval History:  Since last office visit she is down 1 lb She has not been working out in the past month following a back injury and working more days of the week She is tracking steps most days She has a net weight loss of 35 lb in the past 8 mos This is a 16.6% total body weight loss since starting our program without use of antiobesity medication She is down 3.2 lb of muscle mass and is up 3 lb of body fat in the past month She is not logging as many days and her meals are good She is working days and snacking more on cheez its She is packing meals and snacks on work days There have been more kid snacks at home in the summer with less time for workouts  Pharmacotherapy: None  PHYSICAL EXAM:  Blood pressure 124/81, pulse (!) 59, temperature 97.8 F (36.6 C), height 5\' 5"  (1.651 m), weight 175 lb (79.4 kg), SpO2 99 %. Body mass index is 29.12 kg/m.  General: She is healthy appearing, cooperative, alert, well developed, and in no acute distress. PSYCH: Has normal mood, affect and thought process.   Lungs: Normal breathing effort, no conversational dyspnea.   ASSESSMENT AND PLAN  TREATMENT PLAN FOR OBESITY:  Recommended Dietary Goals  Rebekah Wright is currently in the action stage of change. As such, her goal is to  continue weight management plan. She has agreed to the Category 2 Plan.  Behavioral Intervention  We discussed the following Behavioral Modification Strategies today: increasing lean protein intake, decreasing simple carbohydrates , increasing vegetables, increasing lower glycemic fruits, increasing fiber rich foods, avoiding skipping meals, increasing water intake, keeping healthy foods at home, continue to practice mindfulness when eating, and planning for success.  Additional resources provided today: NA  Recommended Physical Activity Goals  Rebekah Wright has been advised to work up to 150 minutes of moderate intensity aerobic activity a week and strengthening exercises 2-3 times per week for cardiovascular health, weight loss maintenance and preservation of muscle mass.   She has agreed to [name] strengthening exercises with a goal of 2-3 sessions a week   Pharmacotherapy changes for the treatment of obesity: None  ASSOCIATED CONDITIONS ADDRESSED TODAY  Essential hypertension Assessment & Plan: Blood pressure well-controlled on HCTZ 12.5 mg once daily. Denies adverse side effects Actively working on weight loss Avoid high sodium foods  Continue active plan for weight reduction, healthy diet and exercise changes.  Continue HCTZ 12.5 mg daily Update chemistry panel next visit  Orders: -     hydroCHLOROthiazide; Take 1 capsule (12.5 mg total) by mouth daily.  Dispense: 90 capsule; Refill: 0  Generalized obesity with initial BMI 34 Assessment & Plan: Reviewed bioimpedance results and  reviewed progress over the past 8 months of medically supervised weight management.  Overall, she has done great.  Her BMI is now less than 30.  She has maintained most of her muscle mass.  She has adopted better eating habits and feels good about her progress.  She is working to achieve a body fat under 35% and a BMI under 27.  Looking at improvements in lipids, A1c with weight reduction.  As she approaches  maintenance phase, will update her indirect calorimetry and change her over to portion control smarter choices.   BMI 29.0-29.9,adult  Pre-diabetes Assessment & Plan: Lab Results  Component Value Date   HGBA1C 5.8 (H) 08/15/2022   She is working on a low sugar/low starch diet rich in lean protein and fiber with meals.  She had been tracking daily steps and added in weight training but this has reduced over the past month due to her work schedule.  She does plan on ramping back up physical activity once her work schedule allows.  She is currently not on metformin.  Continue working on healthy lifestyle changes.  Will aim for a BMI under 27. Consider use of metformin.  Recheck A1c in the next 1 to 2 months   Vitamin D deficiency Assessment & Plan: Last vitamin D Lab Results  Component Value Date   VD25OH 75.7 08/15/2022   She has changed her vitamin D from 50,000 IU once weekly to over-the-counter vitamin D 2000 IU once daily.  Her vitamin D level had improved greatly and her energy level has improved.  Continue vitamin D 2000 IU once daily.  Recheck vitamin D level with next labs.   Hypercholesteremia Assessment & Plan: Lab Results  Component Value Date   CHOL 189 08/15/2022   HDL 48 08/15/2022   LDLCALC 125 (H) 08/15/2022   TRIG 87 08/15/2022   CHOLHDL 3.9 08/15/2022   She has seen LDL reduction over the past few months.  She has lost 16.6% total body weight loss in the past 8 months of medically supervised weight management.  She has abided by a low saturated fat diet.  Recheck fasting lipid panel in the next 2 months       She was informed of the importance of frequent follow up visits to maximize her success with intensive lifestyle modifications for her multiple health conditions.   ATTESTASTION STATEMENTS:  Reviewed by clinician on day of visit: allergies, medications, problem list, medical history, surgical history, family history, social history, and  previous encounter notes pertinent to obesity diagnosis.   I have personally spent 30 minutes total time today in preparation, patient care, nutritional counseling and documentation for this visit, including the following: review of clinical lab tests; review of medical tests/procedures/services.      Glennis Brink, DO DABFM, DABOM [facility] [address] [place], [place] [address] [phone]

## 2022-12-26 NOTE — Assessment & Plan Note (Signed)
Reviewed bioimpedance results and reviewed progress over the past 8 months of medically supervised weight management.  Overall, she has done great.  Her BMI is now less than 30.  She has maintained most of her muscle mass.  She has adopted better eating habits and feels good about her progress.  She is working to achieve a body fat under 35% and a BMI under 27.  Looking at improvements in lipids, A1c with weight reduction.  As she approaches maintenance phase, will update her indirect calorimetry and change her over to portion control smarter choices.

## 2023-01-14 ENCOUNTER — Ambulatory Visit (INDEPENDENT_AMBULATORY_CARE_PROVIDER_SITE_OTHER): Payer: 59 | Admitting: Family Medicine

## 2023-01-14 ENCOUNTER — Encounter (INDEPENDENT_AMBULATORY_CARE_PROVIDER_SITE_OTHER): Payer: Self-pay | Admitting: Family Medicine

## 2023-01-14 VITALS — BP 133/91 | HR 73 | Temp 98.2°F | Ht 65.0 in | Wt 169.0 lb

## 2023-01-14 DIAGNOSIS — E7800 Pure hypercholesterolemia, unspecified: Secondary | ICD-10-CM | POA: Diagnosis not present

## 2023-01-14 DIAGNOSIS — E669 Obesity, unspecified: Secondary | ICD-10-CM

## 2023-01-14 DIAGNOSIS — R7303 Prediabetes: Secondary | ICD-10-CM | POA: Diagnosis not present

## 2023-01-14 DIAGNOSIS — Z6828 Body mass index (BMI) 28.0-28.9, adult: Secondary | ICD-10-CM | POA: Diagnosis not present

## 2023-01-14 DIAGNOSIS — I10 Essential (primary) hypertension: Secondary | ICD-10-CM | POA: Diagnosis not present

## 2023-01-14 NOTE — Assessment & Plan Note (Signed)
Lab Results  Component Value Date   HGBA1C 5.8 (H) 08/15/2022   She has successfully lost 19.5% total body weight loss over the past 8 months of medically supervised weight management.  She has reduced her intake of added sugar and refined carbohydrates will focus on lean protein and fiber with meals.  She is consistent with exercise 3-4 times a week and tracking of steps.  Recheck A1c and fasting insulin next visit.  Continue working on healthy lifestyle changes.

## 2023-01-14 NOTE — Assessment & Plan Note (Signed)
Reviewed patient's overall progress.  She has done a fantastic job maintaining most of her lean body mass while losing body fat.  She is approaching her target weight.  She is consistent with both cardio and resistance training, tracking of daily steps and getting in protein with meals.  She has seen an increase in metabolic rate due to consuming adequate protein intake with meals and snacks, getting adequate sleep at night hide being consistent with regular exercise.  Commended her on her progress.  Will increase her calories to 1600/day, category 3 meal plan provided today with an additional 100 cal.  Recheck fasting labs next visit looking for improvements in lipids and A1c levels given her progress over the past 8 months. Will plan to start maintenance phase in the next 2 to 3 months.

## 2023-01-14 NOTE — Assessment & Plan Note (Signed)
Lab Results  Component Value Date   CHOL 189 08/15/2022   HDL 48 08/15/2022   LDLCALC 125 (H) 08/15/2022   TRIG 87 08/15/2022   CHOLHDL 3.9 08/15/2022   Patient has seen improvements in her cholesterol levels over the past year.  She has lost 19.5% total body weight loss in the past 8 months of medically supervised weight management using a low saturated fat diet.  She is consistent with regular exercise.  Plan to recheck fasting lipid panel next visit.  Continue an omega-3 fish oil supplement daily.

## 2023-01-14 NOTE — Progress Notes (Signed)
Office: [phone]  /  Fax: [phone]  WEIGHT SUMMARY AND BIOMETRICS  Starting Date: 04/24/22  Starting Weight: 210lb   Weight Lost Since Last Visit: 6lb   Vitals Temp: 98.2 F (36.8 C) BP: (!) 133/91 Pulse Rate: 73 SpO2: 99 %   Body Composition  Body Fat %: 33.7 % Fat Mass (lbs): 57 lbs Muscle Mass (lbs): 106.6 lbs Total Body Water (lbs): 71.6 lbs Visceral Fat Rating : 6     HPI  Chief Complaint: OBESITY  Rebekah Wright is here to discuss her progress with her obesity treatment plan. She is on the the Category 2 Plan and states she is following her eating plan approximately 90 % of the time. She states she is swimming 120 minutes 3 times per week.   Interval History:  Since last office visit she is down 6 lb This gives her a net weight loss of 41 pounds in the past 8 months of medically supervised weight management This is a 19.5% total body weight loss without use of antiobesity medication. She increased muscle mass 1.2 lb-- swimming 3 x a week Body fat went down 7.8 lb of body fat in the past 3 weeks, denies meal skipping She is eating on plan- getting lean protein with meals She has been eating more veggies and denies meal skipping She has not been drinking much water Water weight is down 5.6 lb She cut out the M&Ms  Fasting IC performed today BMR has improved from 1699-1858, and increased metabolic rate of 130 cal/day  Pharmacotherapy: None  PHYSICAL EXAM:  Blood pressure (!) 133/91, pulse 73, temperature 98.2 F (36.8 C), height 5\' 5"  (1.651 m), weight 169 lb (76.7 kg), SpO2 99%. Body mass index is 28.12 kg/m.  General: She is healthy appearing, cooperative, alert, well developed, and in no acute distress. PSYCH: Has normal mood, affect and thought process.   Lungs: Normal breathing effort, no conversational dyspnea.   ASSESSMENT AND PLAN  TREATMENT PLAN FOR OBESITY:  Recommended Dietary Goals  Rebekah Wright is currently in the action stage of  change. As such, her goal is to continue weight management plan. She has agreed to the Category 3 Plan. -Changed to category 3 meal plan from category 2+ an additional 100 cal daily -This was done to slow down weight loss as she approaches her target weight and due to increased metabolic rate -Copy of category 3 meal plan given -Increase water intake to 100 ounces daily, avoiding flavor packets with caffeine -Add 1 sugar-free electrolyte drink daily  Behavioral Intervention  We discussed the following Behavioral Modification Strategies today: increasing lean protein intake, decreasing simple carbohydrates , increasing vegetables, increasing lower glycemic fruits, increasing fiber rich foods, avoiding skipping meals, increasing water intake, work on meal planning and preparation, keeping healthy foods at home, continue to practice mindfulness when eating, and planning for success. -Increase fruit intake to 2 servings daily -Add additional calories to breakfast when she is only having a protein shake in the morning  Additional resources provided today: NA  Recommended Physical Activity Goals  Rebekah Wright has been advised to work up to 150 minutes of moderate intensity aerobic activity a week and strengthening exercises 2-3 times per week for cardiovascular health, weight loss maintenance and preservation of muscle mass.   She has agreed to Work on scheduling and tracking physical activity.   Pharmacotherapy changes for the treatment of obesity: None  ASSOCIATED CONDITIONS ADDRESSED TODAY  Essential hypertension Assessment & Plan: Blood pressure is slightly elevated today.  She has not been consistent with taking HCTZ 12.5 mg capsule daily nor checking blood pressure readings at home or at work.  Encouraged her to increase her fluid intake to 100 ounces of water daily, okay to add sugar-free electrolyte packets or sugar-free flavor packets without caffeine.  Encourage compliance with taking  HCTZ 12.5 mg once daily.  Plan to recheck chemistry panel next visit.   Generalized obesity with initial BMI 34 Assessment & Plan: Reviewed patient's overall progress.  She has done a fantastic job maintaining most of her lean body mass while losing body fat.  She is approaching her target weight.  She is consistent with both cardio and resistance training, tracking of daily steps and getting in protein with meals.  She has seen an increase in metabolic rate due to consuming adequate protein intake with meals and snacks, getting adequate sleep at night hide being consistent with regular exercise.  Commended her on her progress.  Will increase her calories to 1600/day, category 3 meal plan provided today with an additional 100 cal.  Recheck fasting labs next visit looking for improvements in lipids and A1c levels given her progress over the past 8 months. Will plan to start maintenance phase in the next 2 to 3 months.   BMI 28.0-28.9,adult  Pre-diabetes Assessment & Plan: Lab Results  Component Value Date   HGBA1C 5.8 (H) 08/15/2022   She has successfully lost 19.5% total body weight loss over the past 8 months of medically supervised weight management.  She has reduced her intake of added sugar and refined carbohydrates will focus on lean protein and fiber with meals.  She is consistent with exercise 3-4 times a week and tracking of steps.  Recheck A1c and fasting insulin next visit.  Continue working on healthy lifestyle changes.   Hypercholesteremia Assessment & Plan: Lab Results  Component Value Date   CHOL 189 08/15/2022   HDL 48 08/15/2022   LDLCALC 125 (H) 08/15/2022   TRIG 87 08/15/2022   CHOLHDL 3.9 08/15/2022   Patient has seen improvements in her cholesterol levels over the past year.  She has lost 19.5% total body weight loss in the past 8 months of medically supervised weight management using a low saturated fat diet.  She is consistent with regular exercise.  Plan to  recheck fasting lipid panel next visit.  Continue an omega-3 fish oil supplement daily.       She was informed of the importance of frequent follow up visits to maximize her success with intensive lifestyle modifications for her multiple health conditions.   ATTESTASTION STATEMENTS:  Reviewed by clinician on day of visit: allergies, medications, problem list, medical history, surgical history, family history, social history, and previous encounter notes pertinent to obesity diagnosis.   I have personally spent 30 minutes total time today in preparation, patient care, nutritional counseling and documentation for this visit, including the following: review of clinical lab tests; review of medical tests/procedures/services.      Glennis Brink, DO DABFM, DABOM [facility] [address] [place], [place] [address] [phone]

## 2023-01-14 NOTE — Assessment & Plan Note (Signed)
Blood pressure is slightly elevated today.  She has not been consistent with taking HCTZ 12.5 mg capsule daily nor checking blood pressure readings at home or at work.  Encouraged her to increase her fluid intake to 100 ounces of water daily, okay to add sugar-free electrolyte packets or sugar-free flavor packets without caffeine.  Encourage compliance with taking HCTZ 12.5 mg once daily.  Plan to recheck chemistry panel next visit.

## 2023-02-13 ENCOUNTER — Ambulatory Visit (INDEPENDENT_AMBULATORY_CARE_PROVIDER_SITE_OTHER): Payer: 59 | Admitting: Family Medicine

## 2023-02-27 ENCOUNTER — Ambulatory Visit (INDEPENDENT_AMBULATORY_CARE_PROVIDER_SITE_OTHER): Payer: 59 | Admitting: Family Medicine

## 2023-02-27 ENCOUNTER — Encounter: Payer: Self-pay | Admitting: Family Medicine

## 2023-02-27 VITALS — BP 133/77 | HR 92 | Temp 98.1°F | Ht 65.0 in | Wt 176.0 lb

## 2023-02-27 DIAGNOSIS — I10 Essential (primary) hypertension: Secondary | ICD-10-CM

## 2023-02-27 DIAGNOSIS — E7800 Pure hypercholesterolemia, unspecified: Secondary | ICD-10-CM | POA: Diagnosis not present

## 2023-02-27 DIAGNOSIS — R5383 Other fatigue: Secondary | ICD-10-CM

## 2023-02-27 DIAGNOSIS — R7303 Prediabetes: Secondary | ICD-10-CM

## 2023-02-27 DIAGNOSIS — E669 Obesity, unspecified: Secondary | ICD-10-CM | POA: Diagnosis not present

## 2023-02-27 DIAGNOSIS — Z6829 Body mass index (BMI) 29.0-29.9, adult: Secondary | ICD-10-CM

## 2023-02-27 MED ORDER — HYDROCHLOROTHIAZIDE 12.5 MG PO CAPS
12.5000 mg | ORAL_CAPSULE | Freq: Every day | ORAL | 0 refills | Status: AC
Start: 2023-02-27 — End: ?

## 2023-02-27 NOTE — Progress Notes (Signed)
Office: [phone]  /  Fax: [phone]  WEIGHT SUMMARY AND BIOMETRICS  Starting Date: 04/24/22  Starting Weight: 210lb   Weight Lost Since Last Visit: 0lb   Vitals Temp: 98.1 F (36.7 C) BP: 133/77 Pulse Rate: 92 SpO2: 99 %   Body Composition  Body Fat %: 35.5 % Fat Mass (lbs): 62.6 lbs Muscle Mass (lbs): 107.8 lbs Total Body Water (lbs): 76 lbs Visceral Fat Rating : 7   HPI  Chief Complaint: OBESITY  Rebekah Wright is here to discuss her progress with her obesity treatment plan. She is on the the Category 3 Plan and states she is following her eating plan approximately 50 % of the time. She states she is exercising 0 minutes 0 times per week.  Interval History:  Since last office visit she is up 7 lb She has a net weight loss of 34 lb in the past 10 mos This is a 16.1% total body weight loss without use of antiobesity medications She is up 1.2 lb of muscle mass and is up 5.6 lb of body fat since last visit She found it harder over the summer balancing work and son with workouts She plans to get back to the gym 2-3 x a week and walk more often She has been working more 12 hrs shifts She had a week at [employer] and got off track with her foods She has been craving more M&Ms She plans to increase water intake and resume outdoor walks  Pharmacotherapy: None  PHYSICAL EXAM:  Blood pressure 133/77, pulse 92, temperature 98.1 F (36.7 C), height 5\' 5"  (1.651 m), weight 176 lb (79.8 kg), SpO2 99%. Body mass index is 29.29 kg/m.  General: She is healthy appearing, cooperative, alert, well developed, and in no acute distress. PSYCH: Has normal mood, affect and thought process.   Lungs: Normal breathing effort, no conversational dyspnea.   ASSESSMENT AND PLAN  TREATMENT PLAN FOR OBESITY:  Recommended Dietary Goals  Rebekah Wright is currently in the action stage of change. As such, her goal is to continue weight management plan. She has agreed to keeping a food journal  and adhering to recommended goals of 1500 calories and 100 g of protein.  Behavioral Intervention  We discussed the following Behavioral Modification Strategies today: increasing lean protein intake, decreasing simple carbohydrates , increasing vegetables, increasing lower glycemic fruits, increasing water intake, work on tracking and journaling calories using tracking application, work on managing stress, creating time for self-care and relaxation measures, avoiding temptations and identifying enticing environmental cues, continue to practice mindfulness when eating, and planning for success.  Additional resources provided today: NA  Recommended Physical Activity Goals  [name] has been advised to work up to 150 minutes of moderate intensity aerobic activity a week and strengthening exercises 2-3 times per week for cardiovascular health, weight loss maintenance and preservation of muscle mass.   She has agreed to Increase the intensity, frequency or duration of strengthening exercises  and Increase the intensity, frequency or duration of aerobic exercises   -Resume walking 45 to 60 minutes 3 days a week -Resume weightlifting workouts at the gym 2 days a week  Pharmacotherapy changes for the treatment of obesity: None  ASSOCIATED CONDITIONS ADDRESSED TODAY  Pre-diabetes Assessment & Plan: Lab Results  Component Value Date   HGBA1C 5.8 (H) 08/15/2022   She has lost 16% total body weight in the past 10 months of medically supervised weight management.  She has reduced her intake of starches and sweets and  has been fairly consistent with regular exercise.  Recheck A1c today.   Essential hypertension Assessment & Plan: Blood pressure has improved on HCTZ 12.5 mg once daily.  She has struggled over the summer to hydrate well with water.  She denies adverse side effects.  Continue HCTZ 12.5 mg once daily and update chemistry panel today.  Aim for 90 ounces of water intake  daily.  Orders: -     hydroCHLOROthiazide; Take 1 capsule (12.5 mg total) by mouth daily.  Dispense: 90 capsule; Refill: 0  Fatigue, unspecified type -     VITAMIN D 25 Hydroxy (Vit-D Deficiency, Fractures) -     Vitamin B12 -     Insulin, random -     Hemoglobin A1c -     Comprehensive metabolic panel -     Lipid panel  Generalized obesity with initial BMI 34  BMI 29.0-29.9,adult  Hypercholesteremia Assessment & Plan: Lab Results  Component Value Date   CHOL 189 08/15/2022   HDL 48 08/15/2022   LDLCALC 125 (H) 08/15/2022   TRIG 87 08/15/2022   CHOLHDL 3.9 08/15/2022  She has not been on lipid-lowering medication.  She has lost 16% total body weight in the past 10 months of medically supervised weight management.  She has been fairly consistent with regular exercise and adopted a low saturated fat diet.  Recheck fasting lipid panel today.       She was informed of the importance of frequent follow up visits to maximize her success with intensive lifestyle modifications for her multiple health conditions.   ATTESTASTION STATEMENTS:  Reviewed by clinician on day of visit: allergies, medications, problem list, medical history, surgical history, family history, social history, and previous encounter notes pertinent to obesity diagnosis.   I have personally spent 30 minutes total time today in preparation, patient care, nutritional counseling and documentation for this visit, including the following: review of clinical lab tests; review of medical tests/procedures/services.      Glennis Brink, DO DABFM, DABOM [facility] [address] [place], [place] [address] [phone]

## 2023-02-27 NOTE — Assessment & Plan Note (Signed)
Lab Results  Component Value Date   CHOL 189 08/15/2022   HDL 48 08/15/2022   LDLCALC 125 (H) 08/15/2022   TRIG 87 08/15/2022   CHOLHDL 3.9 08/15/2022  She has not been on lipid-lowering medication.  She has lost 16% total body weight in the past 10 months of medically supervised weight management.  She has been fairly consistent with regular exercise and adopted a low saturated fat diet.  Recheck fasting lipid panel today.

## 2023-02-27 NOTE — Assessment & Plan Note (Signed)
Blood pressure has improved on HCTZ 12.5 mg once daily.  She has struggled over the summer to hydrate well with water.  She denies adverse side effects.  Continue HCTZ 12.5 mg once daily and update chemistry panel today.  Aim for 90 ounces of water intake daily.

## 2023-02-27 NOTE — Assessment & Plan Note (Signed)
Lab Results  Component Value Date   HGBA1C 5.8 (H) 08/15/2022   She has lost 16% total body weight in the past 10 months of medically supervised weight management.  She has reduced her intake of starches and sweets and has been fairly consistent with regular exercise.  Recheck A1c today.

## 2023-02-28 LAB — COMPREHENSIVE METABOLIC PANEL
ALT: 24 IU/L (ref 0–32)
AST: 20 IU/L (ref 0–40)
Albumin: 4.4 g/dL (ref 3.9–4.9)
Alkaline Phosphatase: 75 IU/L (ref 44–121)
BUN/Creatinine Ratio: 18 (ref 9–23)
BUN: 13 mg/dL (ref 6–24)
Bilirubin Total: 0.3 mg/dL (ref 0.0–1.2)
CO2: 22 mmol/L (ref 20–29)
Calcium: 9.4 mg/dL (ref 8.7–10.2)
Chloride: 105 mmol/L (ref 96–106)
Creatinine, Ser: 0.72 mg/dL (ref 0.57–1.00)
Globulin, Total: 2.1 g/dL (ref 1.5–4.5)
Glucose: 105 mg/dL — ABNORMAL HIGH (ref 70–99)
Potassium: 4.5 mmol/L (ref 3.5–5.2)
Sodium: 140 mmol/L (ref 134–144)
Total Protein: 6.5 g/dL (ref 6.0–8.5)
eGFR: 105 mL/min/{1.73_m2} (ref >59)

## 2023-02-28 LAB — HEMOGLOBIN A1C
Est. average glucose Bld gHb Est-mCnc: 120 mg/dL
Hgb A1c MFr Bld: 5.8 % — ABNORMAL HIGH (ref 4.8–5.6)

## 2023-02-28 LAB — LIPID PANEL
Chol/HDL Ratio: 3.6 ratio (ref 0.0–4.4)
Cholesterol, Total: 207 mg/dL — ABNORMAL HIGH (ref 100–199)
HDL: 57 mg/dL (ref >39)
LDL Chol Calc (NIH): 131 mg/dL — ABNORMAL HIGH (ref 0–99)
Triglycerides: 105 mg/dL (ref 0–149)
VLDL Cholesterol Cal: 19 mg/dL (ref 5–40)

## 2023-02-28 LAB — INSULIN, RANDOM: INSULIN: 10.2 u[IU]/mL (ref 2.6–24.9)

## 2023-02-28 LAB — VITAMIN B12: Vitamin B-12: 1321 pg/mL — ABNORMAL HIGH (ref 232–1245)

## 2023-02-28 LAB — VITAMIN D 25 HYDROXY (VIT D DEFICIENCY, FRACTURES): Vit D, 25-Hydroxy: 49.6 ng/mL (ref 30.0–100.0)

## 2023-03-31 ENCOUNTER — Ambulatory Visit (INDEPENDENT_AMBULATORY_CARE_PROVIDER_SITE_OTHER): Payer: 59 | Admitting: Family Medicine

## 2023-03-31 ENCOUNTER — Encounter: Payer: Self-pay | Admitting: Family Medicine

## 2023-03-31 VITALS — BP 120/81 | HR 70 | Temp 98.1°F | Ht 65.0 in | Wt 174.0 lb

## 2023-03-31 DIAGNOSIS — R7303 Prediabetes: Secondary | ICD-10-CM | POA: Diagnosis not present

## 2023-03-31 DIAGNOSIS — E559 Vitamin D deficiency, unspecified: Secondary | ICD-10-CM | POA: Diagnosis not present

## 2023-03-31 DIAGNOSIS — E669 Obesity, unspecified: Secondary | ICD-10-CM

## 2023-03-31 DIAGNOSIS — I10 Essential (primary) hypertension: Secondary | ICD-10-CM | POA: Diagnosis not present

## 2023-03-31 DIAGNOSIS — E7800 Pure hypercholesterolemia, unspecified: Secondary | ICD-10-CM | POA: Diagnosis not present

## 2023-03-31 DIAGNOSIS — Z6828 Body mass index (BMI) 28.0-28.9, adult: Secondary | ICD-10-CM

## 2023-03-31 MED ORDER — HYDROCHLOROTHIAZIDE 12.5 MG PO CAPS
12.5000 mg | ORAL_CAPSULE | Freq: Every day | ORAL | 0 refills | Status: AC
Start: 2023-03-31 — End: ?

## 2023-03-31 NOTE — Progress Notes (Signed)
Office: [phone]  /  Fax: [phone]  WEIGHT SUMMARY AND BIOMETRICS  Starting Date: 04/24/22  Starting Weight: 210lb   Weight Lost Since Last Visit: 2lb   Vitals Temp: 98.1 F (36.7 C) BP: 120/81 Pulse Rate: 70 SpO2: 99 %   Body Composition  Body Fat %: 35.4 % Fat Mass (lbs): 61.8 lbs Muscle Mass (lbs): 107 lbs Total Body Water (lbs): 74 lbs Visceral Fat Rating : 7    HPI  Chief Complaint: OBESITY  Rebekah Wright is here to discuss her progress with her obesity treatment plan. She is on the the Category 2 Plan and states she is following her eating plan approximately 85 % of the time. She states she is exercising 30-60 minutes 2 times per week.   Interval History:  Since last office visit she is down 2 lb She is down 0.8 pounds of muscle mass and down 0.8 pounds of body fat since last visit She is working 12 hrs shifts 3-4 x a week She was tracking steps but has not been getting in workouts like she was before She has been working more hours than the previous year She is doing well with her eating habits, adding in several handfuls of peanuts while working She is getting 100+ g of protein daily She has not been logging calories daily (eating some of the same foods) She has cut back on M&Ms  Pharmacotherapy: None  PHYSICAL EXAM:  Blood pressure 120/81, pulse 70, temperature 98.1 F (36.7 C), height 5\' 5"  (1.651 m), weight 174 lb (78.9 kg), SpO2 99%. Body mass index is 28.96 kg/m.  General: She is healthy appearing, cooperative, alert, well developed, and in no acute distress. PSYCH: Has normal mood, affect and thought process.   Lungs: Normal breathing effort, no conversational dyspnea.   ASSESSMENT AND PLAN  TREATMENT PLAN FOR OBESITY:  Recommended Dietary Goals  Rebekah Wright is currently in the action stage of change. As such, her goal is to continue weight management plan. She has agreed to practicing portion control and making smarter food choices,  such as increasing vegetables and decreasing simple carbohydrates.  Behavioral Intervention  We discussed the following Behavioral Modification Strategies today: increasing lean protein intake, decreasing simple carbohydrates , increasing vegetables, increasing lower glycemic fruits, increasing water intake, work on meal planning and preparation, keeping healthy foods at home, continue to practice mindfulness when eating, and planning for success.  Additional resources provided today: NA  Recommended Physical Activity Goals  Rebekah Wright has been advised to work up to 150 minutes of moderate intensity aerobic activity a week and strengthening exercises 2-3 times per week for cardiovascular health, weight loss maintenance and preservation of muscle mass.   She has agreed to [name] strengthening exercises with a goal of 2-3 sessions a week  and Start aerobic activity with a goal of 150 minutes a week at moderate intensity.  -Focus on days off work to get a 30 to 45-minute walk in and doing free weights or body weight training from home 2-3 times a week and tracking daily steps on workdays  Pharmacotherapy changes for the treatment of obesity: None  ASSOCIATED CONDITIONS ADDRESSED TODAY  Hypercholesteremia Assessment & Plan: The 10-year ASCVD risk score (Arnett DK, et al., 2019) is: 3.3%   Values used to calculate the score:     Age: 47 years     Sex: Female     Is Non-Hispanic African American: No     Diabetic: No     Tobacco smoker: Yes  Systolic Blood Pressure: 120 mmHg     Is BP treated: Yes     HDL Cholesterol: 57 mg/dL     Total Cholesterol: 207 mg/dL Lab Results  Component Value Date   CHOL 207 (H) 02/27/2023   HDL 57 02/27/2023   LDLCALC 131 (H) 02/27/2023   TRIG 105 02/27/2023   CHOLHDL 3.6 02/27/2023  Reviewed lab results with patient.  She has had marginal changes in her lipid panel from previous numbers.  She has seen a boost of her HDL with dietary change and increased  exercise.  She is currently not on any lipid-lowering medication and her risk factor remains fairly low.  Continue on a low saturated fat diet.  Consider additional testing with an NMR LipoProfile or lipoprotein a    Essential hypertension Assessment & Plan: Blood pressure is well-controlled on HCTZ 12.5 mg once daily without adverse side effect Her blood pressure has not changed much with weight reduction, down 36 pounds in the past 11 months of medically supervised weight management  Continue HCTZ 12.5 mg once daily Avoid high sodium foods  Orders: -     hydroCHLOROthiazide; Take 1 capsule (12.5 mg total) by mouth daily.  Dispense: 90 capsule; Refill: 0  Generalized obesity with initial BMI 34  BMI 28.0-28.9,adult  Pre-diabetes Assessment & Plan: Lab Results  Component Value Date   HGBA1C 5.8 (H) 02/27/2023   Reviewed lab results with patient from last visit.  Her A1c is stable at 5.8.  She does have a history of gestational diabetes and has not seen much improvement in prediabetes with a 17% total body weight loss over the past 11 months of medically supervised weight management.  She has reduced her intake of added sugar and refined carbohydrates.  Recommend continuing on prescribed dietary change plan.  Increase walking time especially on days off work.  Recommended tracking daily steps with a target goal of 10,000 steps a day on both days off and workdays.   Vitamin D deficiency Assessment & Plan: Last vitamin D Lab Results  Component Value Date   VD25OH 49.6 02/27/2023  Reviewed lab results with patient Her vitamin D level is still within normal limits.  She switched from prescription vitamin D 50,000 IU to over-the-counter vitamin D 2000 IU once daily about 4 months ago.  Her energy level remains stable.  With fall and winter months coming, we will increase her over-the-counter vitamin D to 3000 IU once daily recheck level in the next 4 months       She was  informed of the importance of frequent follow up visits to maximize her success with intensive lifestyle modifications for her multiple health conditions.   ATTESTASTION STATEMENTS:  Reviewed by clinician on day of visit: allergies, medications, problem list, medical history, surgical history, family history, social history, and previous encounter notes pertinent to obesity diagnosis.   I have personally spent 30 minutes total time today in preparation, patient care, nutritional counseling and documentation for this visit, including the following: review of clinical lab tests; review of medical tests/procedures/services.      Glennis Brink, DO DABFM, DABOM [facility] [address] [place], [place] [address] [phone]

## 2023-03-31 NOTE — Patient Instructions (Signed)
Increase vitamin D to 3,000 international units  daily  Stop OTC vitamin B12 supplement Repeat level level in 3 mos

## 2023-03-31 NOTE — Assessment & Plan Note (Signed)
Last vitamin D Lab Results  Component Value Date   VD25OH 49.6 02/27/2023  Reviewed lab results with patient Her vitamin D level is still within normal limits.  She switched from prescription vitamin D 50,000 IU to over-the-counter vitamin D 2000 IU once daily about 4 months ago.  Her energy level remains stable.  With fall and winter months coming, we will increase her over-the-counter vitamin D to 3000 IU once daily recheck level in the next 4 months

## 2023-03-31 NOTE — Assessment & Plan Note (Signed)
Lab Results  Component Value Date   HGBA1C 5.8 (H) 02/27/2023   Reviewed lab results with patient from last visit.  Her A1c is stable at 5.8.  She does have a history of gestational diabetes and has not seen much improvement in prediabetes with a 17% total body weight loss over the past 11 months of medically supervised weight management.  She has reduced her intake of added sugar and refined carbohydrates.  Recommend continuing on prescribed dietary change plan.  Increase walking time especially on days off work.  Recommended tracking daily steps with a target goal of 10,000 steps a day on both days off and workdays.

## 2023-03-31 NOTE — Assessment & Plan Note (Signed)
Blood pressure is well-controlled on HCTZ 12.5 mg once daily without adverse side effect Her blood pressure has not changed much with weight reduction, down 36 pounds in the past 11 months of medically supervised weight management  Continue HCTZ 12.5 mg once daily Avoid high sodium foods

## 2023-03-31 NOTE — Assessment & Plan Note (Addendum)
The 10-year ASCVD risk score (Arnett DK, et al., 2019) is: 3.3%   Values used to calculate the score:     Age: 47 years     Sex: Female     Is Non-Hispanic African American: No     Diabetic: No     Tobacco smoker: Yes     Systolic Blood Pressure: 120 mmHg     Is BP treated: Yes     HDL Cholesterol: 57 mg/dL     Total Cholesterol: 207 mg/dL Lab Results  Component Value Date   CHOL 207 (H) 02/27/2023   HDL 57 02/27/2023   LDLCALC 131 (H) 02/27/2023   TRIG 105 02/27/2023   CHOLHDL 3.6 02/27/2023  Reviewed lab results with patient.  She has had marginal changes in her lipid panel from previous numbers.  She has seen a boost of her HDL with dietary change and increased exercise.  She is currently not on any lipid-lowering medication and her risk factor remains fairly low.  Continue on a low saturated fat diet.  Consider additional testing with an NMR LipoProfile or lipoprotein a

## 2023-05-13 ENCOUNTER — Ambulatory Visit: Payer: 59 | Admitting: Family Medicine

## 2023-10-24 ENCOUNTER — Telehealth: Admitting: Emergency Medicine

## 2023-10-24 DIAGNOSIS — J029 Acute pharyngitis, unspecified: Secondary | ICD-10-CM | POA: Diagnosis not present

## 2023-10-24 MED ORDER — AMOXICILLIN-POT CLAVULANATE 875-125 MG PO TABS: 1.0000 | ORAL_TABLET | Freq: Two times a day (BID) | ORAL | 0 refills | Status: AC

## 2023-10-24 NOTE — Progress Notes (Signed)
 Virtual Visit Consent   Rebekah Wright, you are scheduled for a virtual visit with a  provider today. Just as with appointments in the office, your consent must be obtained to participate. Your consent will be active for this visit and any virtual visit you may have with one of our providers in the next 365 days. If you have a MyChart account, a copy of this consent can be sent to you electronically.  As this is a virtual visit, video technology does not allow for your provider to perform a traditional examination. This may limit your provider's ability to fully assess your condition. If your provider identifies any concerns that need to be evaluated in person or the need to arrange testing (such as labs, EKG, etc.), we will make arrangements to do so. Although advances in technology are sophisticated, we cannot ensure that it will always work on either your end or our end. If the connection with a video visit is poor, the visit may have to be switched to a telephone visit. With either a video or telephone visit, we are not always able to ensure that we have a secure connection.  By engaging in this virtual visit, you consent to the provision of healthcare and authorize for your insurance to be billed (if applicable) for the services provided during this visit. Depending on your insurance coverage, you may receive a charge related to this service.  I need to obtain your verbal consent now. Are you willing to proceed with your visit today? Tanita L. Farner has provided verbal consent on 10/24/2023 for a virtual visit (video or telephone). Blinda Burger, NP  Date: 10/24/2023 7:32 PM   Virtual Visit via Video Note   I, Blinda Burger, connected with  Rebekah Wright  ([id number], [birth year]) on 10/24/23 at  6:45 PM EDT by a video-enabled telemedicine application and verified that I am speaking with the correct person using two identifiers.  Location: Patient: Virtual Visit Location  Patient: Home Provider: Virtual Visit Location Provider: Home Office   I discussed the limitations of evaluation and management by telemedicine and the availability of in person appointments. The patient expressed understanding and agreed to proceed.    History of Present Illness: Rebekah Wright is a 49 y.o. who identifies as a female who was assigned female at birth, and is being seen today for throat pain. Has been sick with a cold for 2 weeks. Throat hurts now and she had a fever on and off yesterday - looked in mouth and thinks has tonsil stones on left- has had before. No submandibular lymphadenopathy. She says pharynx is symmetrical - one tonsil isn't bigger than the other. Still has post nasal drainage. Thinks got tonsil stones from all the drainage. Using flonase for this. Tried to gargle salt water but it makes her gag   HPI: HPI  Problems:  Patient Active Problem List   Diagnosis Date Noted   Generalized obesity with initial BMI 34 09/11/2022   Hypercholesteremia 07/17/2022   Other constipation 05/08/2022   SOBOE (shortness of breath on exertion) 04/24/2022   Fatigue 04/24/2022   Vitamin D  deficiency 04/24/2022   Depression screening 04/24/2022   Pre-diabetes 04/23/2022   Essential hypertension 04/23/2022   Encounter for Depo-Provera  contraception 05/19/2018   Visit for routine gyn exam 04/07/2018   Pelvic pain 04/07/2018    Allergies:  Allergies  Allergen Reactions   Sudafed [Pseudoephedrine Hcl]    Medications:  Current Outpatient Medications:  amoxicillin-clavulanate (AUGMENTIN) 875-125 MG tablet, Take 1 tablet by mouth 2 (two) times daily., Disp: 14 tablet, Rfl: 0   cholecalciferol (VITAMIN D3) 25 MCG (1000 UNIT) tablet, Take 2,000 Units by mouth daily., Disp: , Rfl:    co-enzyme Q-10 30 MG capsule, Take 30 mg by mouth 3 (three) times daily., Disp: , Rfl:    hydrochlorothiazide  (MICROZIDE ) 12.5 MG capsule, Take 1 capsule (12.5 mg total) by mouth daily., Disp: 90  capsule, Rfl: 0   levonorgestrel  (MIRENA ) 20 MCG/DAY IUD, 1 each by Intrauterine route once., Disp: , Rfl:    naproxen (NAPROSYN) 500 MG tablet, 1 tablet with food or milk as needed Orally every 12 hrs for 5 days, Disp: , Rfl:    Omega-3 Fatty Acids (FISH OIL) 1000 MG CAPS, Take by mouth., Disp: , Rfl:   Observations/Objective: Patient is well-developed, well-nourished in no acute distress.  Resting comfortably  at home.  Head is normocephalic, atraumatic.  No labored breathing.  Speech is clear and coherent with logical content.  Patient is alert and oriented at baseline.    Assessment and Plan: 1. Sore throat (Primary)  Tonsil stones are a possibility. She has also been sick for 2 weeks. Rx augmentin. Try to gargle salt water   Follow Up Instructions: I discussed the assessment and treatment plan with the patient. The patient was provided an opportunity to ask questions and all were answered. The patient agreed with the plan and demonstrated an understanding of the instructions.  A copy of instructions were sent to the patient via MyChart unless otherwise noted below.   The patient was advised to call back or seek an in-person evaluation if the symptoms worsen or if the condition fails to improve as anticipated.    Blinda Burger, NP

## 2023-10-24 NOTE — Patient Instructions (Signed)
  Rebekah Wright, thank you for joining Blinda Burger, NP for today's virtual visit.  While this provider is not your primary care provider (PCP), if your PCP is located in our provider database this encounter information will be shared with them immediately following your visit.   A [place] [facility] MyChart account gives you access to today's visit and all your visits, tests, and labs performed at [facility] " click here if you don't have a [facility] MyChart account or go to [email or web address]  Consent: (Patient) Rebekah Wright provided verbal consent for this virtual visit at the beginning of the encounter.  Current Medications:  Current Outpatient Medications:    amoxicillin-clavulanate (AUGMENTIN) 875-125 MG tablet, Take 1 tablet by mouth 2 (two) times daily., Disp: 14 tablet, Rfl: 0   cholecalciferol (VITAMIN D3) 25 MCG (1000 UNIT) tablet, Take 2,000 Units by mouth daily., Disp: , Rfl:    co-enzyme Q-10 30 MG capsule, Take 30 mg by mouth 3 (three) times daily., Disp: , Rfl:    hydrochlorothiazide  (MICROZIDE ) 12.5 MG capsule, Take 1 capsule (12.5 mg total) by mouth daily., Disp: 90 capsule, Rfl: 0   levonorgestrel  (MIRENA ) 20 MCG/DAY IUD, 1 each by Intrauterine route once., Disp: , Rfl:    naproxen (NAPROSYN) 500 MG tablet, 1 tablet with food or milk as needed Orally every 12 hrs for 5 days, Disp: , Rfl:    Omega-3 Fatty Acids (FISH OIL) 1000 MG CAPS, Take by mouth., Disp: , Rfl:    Medications ordered in this encounter:  Meds ordered this encounter  Medications   amoxicillin-clavulanate (AUGMENTIN) 875-125 MG tablet    Sig: Take 1 tablet by mouth 2 (two) times daily.    Dispense:  14 tablet    Refill:  0     *If you need refills on other medications prior to your next appointment, please contact your pharmacy*  Follow-Up: Call back or seek an in-person evaluation if the symptoms worsen or if the condition fails to improve as anticipated.  [facility] [phone]  Other Instructions  Try to gargle with salt water if you can.   Get seen in person if you are not getting better.    If you have been instructed to have an in-person evaluation today at a local Urgent Care facility, please use the link below. It will take you to a list of all of our available [facility], including address, phone number and hours of operation. Please do not delay care.  [facility]  If you or a family member do not have a primary care provider, use the link below to schedule a visit and establish care. When you choose a [facility] primary care physician or advanced practice provider, you gain a long-term partner in health. Find a Primary Care Provider  Learn more about [facility]'s in-office and virtual care options: [place] - Get Care Now

## 2024-07-25 ENCOUNTER — Telehealth: Payer: Self-pay | Admitting: Family

## 2024-07-25 DIAGNOSIS — B3731 Acute candidiasis of vulva and vagina: Secondary | ICD-10-CM

## 2024-07-25 MED ORDER — FLUCONAZOLE 150 MG PO TABS: 150.0000 mg | ORAL_TABLET | ORAL | 0 refills | Status: AC | PRN

## 2024-07-25 NOTE — Progress Notes (Signed)
